# Patient Record
Sex: Male | Born: 1960 | Race: White | Hispanic: No | Marital: Single | Smoking: Current every day smoker
Health system: Southern US, Community
[De-identification: ages and names within clinical notes are randomized; demographics above are authoritative.]

## PROBLEM LIST (undated history)

## (undated) DIAGNOSIS — M5020 Other cervical disc displacement, unspecified cervical region: Secondary | ICD-10-CM

## (undated) DIAGNOSIS — N189 Chronic kidney disease, unspecified: Secondary | ICD-10-CM

## (undated) DIAGNOSIS — R011 Cardiac murmur, unspecified: Secondary | ICD-10-CM

## (undated) DIAGNOSIS — R45851 Suicidal ideations: Secondary | ICD-10-CM

## (undated) DIAGNOSIS — E785 Hyperlipidemia, unspecified: Secondary | ICD-10-CM

## (undated) DIAGNOSIS — F603 Borderline personality disorder: Secondary | ICD-10-CM

## (undated) DIAGNOSIS — F259 Schizoaffective disorder, unspecified: Secondary | ICD-10-CM

## (undated) DIAGNOSIS — F4310 Post-traumatic stress disorder, unspecified: Secondary | ICD-10-CM

## (undated) DIAGNOSIS — F329 Major depressive disorder, single episode, unspecified: Secondary | ICD-10-CM

## (undated) DIAGNOSIS — N529 Male erectile dysfunction, unspecified: Secondary | ICD-10-CM

## (undated) DIAGNOSIS — N400 Enlarged prostate without lower urinary tract symptoms: Secondary | ICD-10-CM

---

## 2000-08-31 ENCOUNTER — Inpatient Hospital Stay (HOSPITAL_COMMUNITY): Admission: EM | Admit: 2000-08-31 | Discharge: 2000-09-07 | Payer: Self-pay | Admitting: Psychiatry

## 2001-01-26 ENCOUNTER — Inpatient Hospital Stay (HOSPITAL_COMMUNITY): Admission: EM | Admit: 2001-01-26 | Discharge: 2001-01-29 | Payer: Self-pay | Admitting: *Deleted

## 2001-01-26 ENCOUNTER — Encounter: Admission: RE | Admit: 2001-01-26 | Discharge: 2001-01-26 | Payer: Self-pay | Admitting: Emergency Medicine

## 2003-01-03 ENCOUNTER — Emergency Department (HOSPITAL_COMMUNITY): Admission: EM | Admit: 2003-01-03 | Discharge: 2003-01-03 | Payer: Self-pay | Admitting: Emergency Medicine

## 2003-01-22 ENCOUNTER — Inpatient Hospital Stay (HOSPITAL_COMMUNITY): Admission: EM | Admit: 2003-01-22 | Discharge: 2003-02-07 | Payer: Self-pay | Admitting: Psychiatry

## 2003-01-25 ENCOUNTER — Encounter (HOSPITAL_COMMUNITY): Payer: Self-pay | Admitting: Psychiatry

## 2003-03-03 ENCOUNTER — Encounter: Payer: Self-pay | Admitting: Emergency Medicine

## 2003-03-03 ENCOUNTER — Emergency Department (HOSPITAL_COMMUNITY): Admission: EM | Admit: 2003-03-03 | Discharge: 2003-03-04 | Payer: Self-pay | Admitting: Emergency Medicine

## 2003-09-02 ENCOUNTER — Emergency Department (HOSPITAL_COMMUNITY): Admission: EM | Admit: 2003-09-02 | Discharge: 2003-09-02 | Payer: Self-pay | Admitting: Emergency Medicine

## 2004-08-16 ENCOUNTER — Inpatient Hospital Stay (HOSPITAL_COMMUNITY): Admission: RE | Admit: 2004-08-16 | Discharge: 2004-08-23 | Payer: Self-pay | Admitting: Psychiatry

## 2005-10-08 ENCOUNTER — Emergency Department (HOSPITAL_COMMUNITY): Admission: EM | Admit: 2005-10-08 | Discharge: 2005-10-08 | Payer: Self-pay | Admitting: Emergency Medicine

## 2013-01-23 ENCOUNTER — Encounter (HOSPITAL_COMMUNITY): Payer: Self-pay | Admitting: Internal Medicine

## 2013-01-23 ENCOUNTER — Other Ambulatory Visit: Payer: Self-pay

## 2013-01-23 ENCOUNTER — Inpatient Hospital Stay (HOSPITAL_COMMUNITY): Payer: Medicare Other

## 2013-01-23 ENCOUNTER — Inpatient Hospital Stay (HOSPITAL_COMMUNITY)
Admit: 2013-01-23 | Discharge: 2013-02-04 | DRG: 917 | Disposition: A | Discharge status: Home / Self Care | Payer: Medicare Other | Attending: Internal Medicine | Admitting: Internal Medicine

## 2013-01-23 DIAGNOSIS — E876 Hypokalemia: Secondary | ICD-10-CM | POA: Diagnosis present

## 2013-01-23 DIAGNOSIS — J9600 Acute respiratory failure, unspecified whether with hypoxia or hypercapnia: Secondary | ICD-10-CM | POA: Diagnosis present

## 2013-01-23 DIAGNOSIS — E872 Acidosis, unspecified: Secondary | ICD-10-CM | POA: Diagnosis present

## 2013-01-23 DIAGNOSIS — F4310 Post-traumatic stress disorder, unspecified: Secondary | ICD-10-CM | POA: Diagnosis present

## 2013-01-23 DIAGNOSIS — R45851 Suicidal ideations: Secondary | ICD-10-CM

## 2013-01-23 DIAGNOSIS — R9431 Abnormal electrocardiogram [ECG] [EKG]: Secondary | ICD-10-CM

## 2013-01-23 DIAGNOSIS — Z7982 Long term (current) use of aspirin: Secondary | ICD-10-CM

## 2013-01-23 DIAGNOSIS — T6592XA Toxic effect of unspecified substance, intentional self-harm, initial encounter: Secondary | ICD-10-CM | POA: Diagnosis present

## 2013-01-23 DIAGNOSIS — N189 Chronic kidney disease, unspecified: Secondary | ICD-10-CM | POA: Diagnosis present

## 2013-01-23 DIAGNOSIS — Z79899 Other long term (current) drug therapy: Secondary | ICD-10-CM

## 2013-01-23 DIAGNOSIS — N179 Acute kidney failure, unspecified: Secondary | ICD-10-CM

## 2013-01-23 DIAGNOSIS — T528X1A Toxic effect of other organic solvents, accidental (unintentional), initial encounter: Secondary | ICD-10-CM

## 2013-01-23 DIAGNOSIS — T65891A Toxic effect of other specified substances, accidental (unintentional), initial encounter: Secondary | ICD-10-CM

## 2013-01-23 DIAGNOSIS — E870 Hyperosmolality and hypernatremia: Secondary | ICD-10-CM | POA: Diagnosis present

## 2013-01-23 DIAGNOSIS — N400 Enlarged prostate without lower urinary tract symptoms: Secondary | ICD-10-CM | POA: Diagnosis present

## 2013-01-23 DIAGNOSIS — T6591XA Toxic effect of unspecified substance, accidental (unintentional), initial encounter: Secondary | ICD-10-CM

## 2013-01-23 DIAGNOSIS — T50902A Poisoning by unspecified drugs, medicaments and biological substances, intentional self-harm, initial encounter: Secondary | ICD-10-CM | POA: Diagnosis present

## 2013-01-23 DIAGNOSIS — K5900 Constipation, unspecified: Secondary | ICD-10-CM | POA: Diagnosis present

## 2013-01-23 DIAGNOSIS — G9340 Encephalopathy, unspecified: Secondary | ICD-10-CM | POA: Diagnosis present

## 2013-01-23 DIAGNOSIS — R319 Hematuria, unspecified: Secondary | ICD-10-CM | POA: Diagnosis present

## 2013-01-23 HISTORY — DX: Chronic kidney disease, unspecified: N18.9

## 2013-01-23 HISTORY — DX: Hyperlipidemia, unspecified: E78.5

## 2013-01-23 HISTORY — DX: Borderline personality disorder: F60.3

## 2013-01-23 HISTORY — DX: Major depressive disorder, single episode, unspecified: F32.9

## 2013-01-23 HISTORY — DX: Suicidal ideations: R45.851

## 2013-01-23 HISTORY — DX: Male erectile dysfunction, unspecified: N52.9

## 2013-01-23 HISTORY — DX: Benign prostatic hyperplasia without lower urinary tract symptoms: N40.0

## 2013-01-23 HISTORY — DX: Other cervical disc displacement, unspecified cervical region: M50.20

## 2013-01-23 HISTORY — DX: Cardiac murmur, unspecified: R01.1

## 2013-01-23 HISTORY — DX: Schizoaffective disorder, unspecified: F25.9

## 2013-01-23 HISTORY — DX: Post-traumatic stress disorder, unspecified: F43.10

## 2013-01-23 LAB — COMPREHENSIVE METABOLIC PANEL
AST: 19 U/L (ref 0–37)
Albumin: 4 g/dL (ref 3.5–5.2)
Alkaline Phosphatase: 108 U/L (ref 39–117)
BUN: 14 mg/dL (ref 6–23)
Potassium: 4.5 mEq/L (ref 3.5–5.1)
Total Protein: 7.3 g/dL (ref 6.0–8.3)

## 2013-01-23 LAB — CBC
HCT: 43.4 % (ref 39.0–52.0)
MCHC: 33.4 g/dL (ref 30.0–36.0)
Platelets: 227 10*3/uL (ref 150–400)
RDW: 13.3 % (ref 11.5–15.5)
WBC: 5.3 10*3/uL (ref 4.0–10.5)

## 2013-01-23 LAB — BLOOD GAS, ARTERIAL
Acid-base deficit: 8 mmol/L — ABNORMAL HIGH (ref 0.0–2.0)
Bicarbonate: 16.1 mEq/L — ABNORMAL LOW (ref 20.0–24.0)
FIO2: 0.4 %
O2 Saturation: 97.8 %
RATE: 28 resp/min
pCO2 arterial: 20 mmHg — ABNORMAL LOW (ref 35.0–45.0)
pH, Arterial: 7.289 — ABNORMAL LOW (ref 7.350–7.450)
pO2, Arterial: 149 mmHg — ABNORMAL HIGH (ref 80.0–100.0)

## 2013-01-23 LAB — BASIC METABOLIC PANEL
CO2: 18 mEq/L — ABNORMAL LOW (ref 19–32)
Calcium: 8.4 mg/dL (ref 8.4–10.5)
GFR calc non Af Amer: 51 mL/min — ABNORMAL LOW (ref >90)
Potassium: 3.8 mEq/L (ref 3.5–5.1)
Sodium: 152 mEq/L — ABNORMAL HIGH (ref 135–145)

## 2013-01-23 LAB — ACETAMINOPHEN LEVEL: Acetaminophen (Tylenol), Serum: <15 ug/mL (ref 10–30)

## 2013-01-23 LAB — RAPID URINE DRUG SCREEN, HOSP PERFORMED
Benzodiazepines: NOT DETECTED
Cocaine: NOT DETECTED
Opiates: NOT DETECTED
Tetrahydrocannabinol: NOT DETECTED

## 2013-01-23 LAB — URINALYSIS, ROUTINE W REFLEX MICROSCOPIC
Glucose, UA: NEGATIVE mg/dL
Glucose, UA: 100 mg/dL — AB
Leukocytes, UA: NEGATIVE
Specific Gravity, Urine: 1.009 (ref 1.005–1.030)
Urobilinogen, UA: 0.2 mg/dL (ref 0.0–1.0)
pH: 5.5 (ref 5.0–8.0)
pH: 6 (ref 5.0–8.0)

## 2013-01-23 LAB — ETHANOL: Alcohol, Ethyl (B): <11 mg/dL (ref 0–11)

## 2013-01-23 LAB — LACTIC ACID, PLASMA: Lactic Acid, Venous: 6 mmol/L — ABNORMAL HIGH (ref 0.5–2.2)

## 2013-01-23 LAB — URINE MICROSCOPIC-ADD ON

## 2013-01-23 LAB — OSMOLALITY: Osmolality: 380 mOsm/kg — ABNORMAL HIGH (ref 275–300)

## 2013-01-23 MED ORDER — SODIUM CHLORIDE 0.9 % IV BOLUS (SEPSIS)
1000.0000 mL | Freq: Once | INTRAVENOUS | Status: AC
  Administered 2013-01-23: 1000 mL via INTRAVENOUS

## 2013-01-23 MED ORDER — PROPOFOL 10 MG/ML IV EMUL
INTRAVENOUS | Status: AC
  Filled 2013-01-23: qty 100

## 2013-01-23 MED ORDER — THIAMINE HCL 100 MG/ML IJ SOLN: 100.0000 mg | Freq: Every day | INTRAMUSCULAR | Status: DC

## 2013-01-23 MED ORDER — PYRIDOXINE HCL 100 MG/ML IJ SOLN: 50.0000 mg | Freq: Every day | INTRAMUSCULAR | Status: DC

## 2013-01-23 MED ORDER — SODIUM CHLORIDE 0.9 % IV SOLN
INTRAVENOUS | Status: DC
  Administered 2013-01-23: 16:00:00 via INTRAVENOUS

## 2013-01-23 MED ORDER — PYRIDOXINE HCL 100 MG/ML IJ SOLN
100.0000 mg | Freq: Four times a day (QID) | INTRAMUSCULAR | Status: DC
  Administered 2013-01-23 – 2013-01-27 (×14): 100 mg via INTRAVENOUS
  Filled 2013-01-23 (×19): qty 1

## 2013-01-23 MED ORDER — SODIUM BICARBONATE 8.4 % IV SOLN
INTRAVENOUS | Status: AC
  Filled 2013-01-23: qty 150

## 2013-01-23 MED ORDER — SODIUM BICARBONATE 8.4 % IV SOLN
INTRAVENOUS | Status: AC
  Filled 2013-01-23: qty 50

## 2013-01-23 MED ORDER — SODIUM CHLORIDE 0.9 % IV SOLN
INTRAVENOUS | Status: DC
  Administered 2013-01-23: 22:00:00 via INTRAVENOUS

## 2013-01-23 MED ORDER — PROPOFOL 10 MG/ML IV EMUL
5.0000 ug/kg/min | INTRAVENOUS | Status: DC
  Administered 2013-01-23: 1000 mg via INTRAVENOUS
  Administered 2013-01-23: 40 ug/kg/min via INTRAVENOUS
  Administered 2013-01-24: 70 ug/kg/min via INTRAVENOUS
  Administered 2013-01-24: 60 ug/kg/min via INTRAVENOUS
  Administered 2013-01-24: 70 ug/kg/min via INTRAVENOUS
  Filled 2013-01-23 (×4): qty 100

## 2013-01-23 MED ORDER — BIOTENE DRY MOUTH MT LIQD
15.0000 mL | Freq: Four times a day (QID) | OROMUCOSAL | Status: DC
  Administered 2013-01-24 (×4): 15 mL via OROMUCOSAL

## 2013-01-23 MED ORDER — SODIUM CHLORIDE 0.45 % IV SOLN
INTRAVENOUS | Status: DC
  Administered 2013-01-23 – 2013-01-24 (×2): via INTRAVENOUS

## 2013-01-23 MED ORDER — SODIUM CHLORIDE 0.9 % IV SOLN
15.0000 mg/kg | Freq: Once | INTRAVENOUS | Status: AC
  Administered 2013-01-23: 1500 mg via INTRAVENOUS
  Filled 2013-01-23: qty 1.5

## 2013-01-23 MED ORDER — SODIUM CHLORIDE 0.9 % IV SOLN: 50.0000 mg | Freq: Four times a day (QID) | INTRAVENOUS | Status: DC

## 2013-01-23 MED ORDER — FOMEPIZOLE 1 GM/ML IV SOLN
10.0000 mg/kg | INTRAVENOUS | Status: AC
  Administered 2013-01-24 (×2): 1000 mg via INTRAVENOUS
  Filled 2013-01-23 (×2): qty 1

## 2013-01-23 MED ORDER — MIDAZOLAM HCL 2 MG/2ML IJ SOLN
INTRAMUSCULAR | Status: AC
  Administered 2013-01-23: 2 mg
  Filled 2013-01-23: qty 2

## 2013-01-23 MED ORDER — PROPOFOL 10 MG/ML IV EMUL
INTRAVENOUS | Status: AC
  Administered 2013-01-23: 1000 mg via INTRAVENOUS
  Filled 2013-01-23: qty 100

## 2013-01-23 MED ORDER — SUCCINYLCHOLINE CHLORIDE 20 MG/ML IJ SOLN
100.0000 mg | Freq: Once | INTRAMUSCULAR | Status: AC
  Administered 2013-01-23: 100 mg via INTRAVENOUS

## 2013-01-23 MED ORDER — SODIUM BICARBONATE 8.4 % IV SOLN
Freq: Once | INTRAVENOUS | Status: AC
  Administered 2013-01-23: 18:00:00 via INTRAVENOUS
  Filled 2013-01-23: qty 150

## 2013-01-23 MED ORDER — FOLIC ACID 5 MG/ML IJ SOLN: 50.0000 mg | Freq: Every day | INTRAMUSCULAR | Status: DC

## 2013-01-23 MED ORDER — MIDAZOLAM HCL 2 MG/2ML IJ SOLN
INTRAMUSCULAR | Status: AC
  Administered 2013-01-23: 2 mg
  Filled 2013-01-23: qty 4

## 2013-01-23 MED ORDER — HEPARIN SODIUM (PORCINE) 1000 UNIT/ML IJ SOLN
INTRAMUSCULAR | Status: AC
  Administered 2013-01-23: 2400 [IU]
  Filled 2013-01-23: qty 4

## 2013-01-23 MED ORDER — CHLORHEXIDINE GLUCONATE 0.12 % MT SOLN
15.0000 mL | Freq: Two times a day (BID) | OROMUCOSAL | Status: DC
Start: 2013-01-23 — End: 2013-01-24
  Administered 2013-01-23 – 2013-01-24 (×2): 15 mL via OROMUCOSAL
  Filled 2013-01-23 (×2): qty 15

## 2013-01-23 MED ORDER — SODIUM CHLORIDE 0.9 % IV SOLN
50.0000 mg | Freq: Four times a day (QID) | INTRAVENOUS | Status: AC
  Administered 2013-01-23 – 2013-01-24 (×4): 50 mg via INTRAVENOUS
  Filled 2013-01-23 (×6): qty 10

## 2013-01-23 MED ORDER — PANTOPRAZOLE SODIUM 40 MG IV SOLR
40.0000 mg | INTRAVENOUS | Status: DC
  Administered 2013-01-23: 40 mg via INTRAVENOUS
  Filled 2013-01-23 (×2): qty 40

## 2013-01-23 MED ORDER — THIAMINE HCL 100 MG/ML IJ SOLN
100.0000 mg | Freq: Four times a day (QID) | INTRAMUSCULAR | Status: DC
  Administered 2013-01-23 – 2013-01-27 (×14): 100 mg via INTRAVENOUS
  Filled 2013-01-23 (×19): qty 1

## 2013-01-23 NOTE — ED Notes (Signed)
Pt appears in a stupor, unable to make comprehensible speech.

## 2013-01-23 NOTE — H&P (Signed)
PULMONARY  / CRITICAL CARE MEDICINE  Name: Todd Fitzgerald MRN: [id number] DOB: June 04, 1961    ADMISSION DATE:  01/23/2013 CONSULTATION DATE:  1/26  REFERRING MD :  Dr. Bernette Mayers  CHIEF COMPLAINT:  Possible toxic ingestion. ? antifreeze ingestion  BRIEF PATIENT DESCRIPTION:  52 y.o with possibly ingestion antifreeze.  He has had toxic ingestion of antifreeze in the past and multiple suicide attempts.   SIGNIFICANT EVENTS / STUDIES:  1/26 Admitted   LINES / TUBES: 1/26-ETT  126-foley 1/26 HD catheter  1/26 NGT  CULTURES: 1/26 MRSA screen  1/26 urine culture   ANTIBIOTICS: none  HISTORY OF PRESENT ILLNESS:   52 y.o male brought to the [facility] ED earlier 1/26.  His sister was called at 18 am by his girlfriend who stated the patient was acting delerious and funny and called 911.   He was at his girlfriends home on a home visit from his group home today and started acting strange with thoughts he possibly ingested antifreeze.  He has had toxic ingestions of antifreeze in the past multiple times. He has been hospitalized at [facility] x 3 years and [facility] hospital in the past. Patient lives in a [facility] group home in [place] since 05/03/2012.  Spoke with Shonta at the home who provided history as well as sister [name].  Records pending to be faxed from [facility].    PAST MEDICAL HISTORY :  Past Medical History  Diagnosis Date  . Suicidal ideation     overdose drugs age 5, last 7 years antifreeze ingestion x 4 times  . CKD (chronic kidney disease)     questionable  . Heart murmur   . MDD (major depressive disorder)   . ED (erectile dysfunction)   . BPH (benign prostatic hyperplasia)   . PTSD (post-traumatic stress disorder)   . HLD (hyperlipidemia)   . Borderline personality disorder   . Schizoaffective disorder   . Ruptured disc, cervical    No past surgical history on file.  Prescriptions prior to admission  Medication Sig Dispense Refill  . aspirin 81 MG  tablet Take 81 mg by mouth daily.      . benztropine (COGENTIN) 1 MG tablet Take 3 mg by mouth 2 (two) times daily.      . cholecalciferol (VITAMIN D) 400 UNITS TABS Take 400 Units by mouth. 2 tablets qhs      . cloZAPine (CLOZARIL) 100 MG tablet Take 200 mg by mouth every morning.      . clozapine (FAZACLO) 100 MG disintegrating tablet Take 300 mg by mouth at bedtime.      . fish oil-omega-3 fatty acids 1000 MG capsule Take 2 g by mouth at bedtime.      Marland Kitchen gemfibrozil (LOPID) 600 MG tablet Take 600 mg by mouth 2 (two) times daily.      . haloperidol (HALDOL) 2 MG tablet Take 2 mg by mouth every morning.      . haloperidol (HALDOL) 5 MG tablet Take 5 mg by mouth daily. 5 PM      . isoniazid (NYDRAZID) 100 MG tablet Take 100 mg by mouth daily. 3 tablets on an empty stomach daily      . lamoTRIgine (LAMICTAL) 100 MG tablet Take 100 mg by mouth daily. 2.5 tablets at bedtime      . LORazepam (ATIVAN) 0.5 MG tablet Take 0.5 mg by mouth 2 (two) times daily as needed.      . polyethylene glycol (MIRALAX /  GLYCOLAX) packet Take 17 g by mouth daily.      Marland Kitchen pyridOXINE (VITAMIN B-6) 50 MG tablet Take 50 mg by mouth daily. 1/2 tablet daily      . ranitidine (ZANTAC) 150 MG tablet Take 150 mg by mouth 2 (two) times daily.      . selegiline (EMSAM) 12 MG/24HR Place 1 patch onto the skin daily.      Marland Kitchen senna (SENOKOT) 8.6 MG tablet Take 1 tablet by mouth 2 (two) times daily.      . simvastatin (ZOCOR) 20 MG tablet Take 20 mg by mouth daily.      . Tamsulosin HCl (FLOMAX) 0.4 MG CAPS Take 0.4 mg by mouth daily after breakfast.      . topiramate (TOPAMAX) 50 MG tablet Take 50 mg by mouth at bedtime.      . traZODone (DESYREL) 100 MG tablet Take 100 mg by mouth at bedtime. 2 tablets at bedtime      . vardenafil (LEVITRA) 10 MG tablet Take 10 mg by mouth as needed.        Allergies no known allergies  FAMILY HISTORY: per sister denies   SOCIAL HISTORY: Lives in a [facility] group home   REVIEW OF SYSTEMS:     Unable to obtain  SUBJECTIVE: Unable to obtain  VITAL SIGNS: Temp:  [97.5 F (36.4 C)-98.1 F (36.7 C)] 97.5 F (36.4 C) (01/26 2011) Pulse Rate:  [77-91] 78  (01/26 2310) Resp:  [11-27] 27  (01/26 2310) BP: (106-131)/(72-100) 122/74 mmHg (01/26 2310) SpO2:  [97 %-100 %] 100 % (01/26 2310) FiO2 (%):  [30 %-40 %] 30 % (01/26 2310) Weight:  [220 lb (99.791 kg)] 220 lb (99.791 kg) (01/26 1313) HEMODYNAMICS:   VENTILATOR SETTINGS: Vent Mode:  [-] PRVC FiO2 (%):  [30 %-40 %] 30 % Set Rate:  [14 bmp-28 bmp] 28 bmp Vt Set:  [500 mL] 500 mL PEEP:  [5 cmH20] 5 cmH20 Plateau Pressure:  [16 cmH20] 16 cmH20 INTAKE / OUTPUT: Intake/Output      01/26 0701 - 01/27 0700   Urine (mL/kg/hr) 3200 (1.9)   Total Output 3200   Net -3200         PHYSICAL EXAMINATION: General:  Lying in bed Neuro:  Sedated, perrl b/l HEENT:  [place]/at Cardiovascular:  RRR no murmurs or gallops  Lungs:  ctab Abdomen:  Soft, distended, nt, hypoactive bs Musculoskeletal:  Warm no cyanosis or edema Skin:  Intact   LABS:  Lab 01/23/13 2206 01/23/13 2145 01/23/13 2125 01/23/13 1725 01/23/13 1646 01/23/13 1450  HGB -- -- -- -- -- 14.5  WBC -- -- -- -- -- 5.3  PLT -- -- -- -- -- 227  NA -- 152* -- -- -- 138  K -- 3.8 -- -- -- 4.5  CL -- 117* -- -- -- 102  CO2 -- 18* -- -- -- 10*  GLUCOSE -- 87 -- -- -- 115*  BUN -- 14 -- -- -- 14  CREATININE -- 1.55* -- -- -- 1.32  CALCIUM -- 8.4 -- -- -- 9.5  MG -- -- -- -- -- --  PHOS -- -- -- -- -- --  AST -- -- -- -- -- 19  ALT -- -- -- -- -- 13  ALKPHOS -- -- -- -- -- 108  BILITOT -- -- -- -- -- 0.2*  PROT -- -- -- -- -- 7.3  ALBUMIN -- -- -- -- -- 4.0  APTT -- -- -- -- -- --  INR -- -- -- -- -- --  LATICACIDVEN -- -- 4.4* -- 6.0* --  TROPONINI -- -- -- -- -- --  PROCALCITON -- -- -- -- -- --  PROBNP -- -- -- -- -- --  O2SATVEN -- -- -- -- -- --  PHART 7.387 -- -- 7.289* -- --  PCO2ART 27.2* -- -- 20.0* -- --  PO2ART 149.0* -- -- 127.0* -- --    Lab  01/23/13 1951  GLUCAP 175*    UDS negative  Pending ethylene glycol and methanol levels  Salicylate level <2.0 Acetaminophen <15.0  Alcohol <11  Serum osmolality 380   CXR:  1/26 CXR Findings: ET tube is in good position with the tip at the level of  the clavicular heads. Tip of right IJ approach dialysis catheter  projects over the upper superior vena cava. Lungs are clear. No  pneumothorax. Heart size normal.  IMPRESSION:  1. ET tube in good position.  2. Tip of right IJ approach dialysis catheter projects over the  upper superior vena cava. No pneumothorax.  3. No acute finding.   ASSESSMENT / PLAN:  PULMONARY  Lab 01/23/13 2206 01/23/13 1725  PHART 7.387 7.289*  PCO2ART 27.2* 20.0*  PO2ART 149.0* 127.0*  HCO3 16.1* 9.3*  TCO2 17.0 8.3  O2SAT 97.8 97.9    A: ? TB status on INH prior to admission - Intubated for airway protection. (deep sedation needed for emergent HD catheter placement due to agitation)  P:   - Full mechanical ventilation pH>7.3, O2>92% - PRVC, Vt: 8 cc/kg, PEEP: 5, RR: 24 - VAP prevention order set  CARDIOVASCULAR Initial EKG with prolonged QT, nonspecific T wave changes  A:  - Prolonged QTc (531), resolved  - Hemodynamically stable  P:  - Serial EKG - ICU monitoring  RENAL A:   History of BPH Hypernatremia (152) AKI Creatinine 1.55  Acidosis ? Etiology Ethylene glycol ingestion AG 26  Lactic acidosis 6.0-->4.4  Serum osmolality 380 (high) Osmolar gap 82 (high) Hematuria   P:   serial ABGs Status post Fomepizole x 1, will continue Fomepizole  q 4 hours post HD per pharmacy   Renal consulted Dr. Arlean Hopping aware for emergent HD; per renal recommendations:  BMET and ABG 3 hours after HD; methanol and ethylene glycol level 2 hours post HD;  BMET ABG, serum os in 6 hours  Pending ethylene glycol, methanol levels Already given sodium bicarb 1L, NS 1L, now with NS 150 cc/hr  BMET q4, Lactic acid q6  Thiamine 100 iv qd, B6 50  mg iv qd, Folic acid 50 iv q6 Poison control aware  Strict intake and output  GASTROINTESTINAL A:   Possible Toxic ingestion antifreeze Nutrition   P:   Given Fomepizole, 2L NS, sodium bicarbonate, now getting NS 150 cc/hr NPO Protonix    HEMATOLOGIC A:  No acute issues  DVT px   P:  Trend cbc  scds   INFECTIOUS A:   No active issues  P:   Follow cultures  ENDOCRINE A:   No active issues  P:   cbg q4   NEUROLOGIC/PSYCHIATRIC A:   History of schizoaffective disorder, MDD, PTSD, borderline personality disorder with multiple suicide attempts since age 35 Agitated  Acute encephalopathy   P:   Propofol for RASS 0 to -1  Pending records to review psychiatric medications   TODAY'S SUMMARY: 52 y.o with now intubated on propofol with possibly ingestion antifreeze with multiple suicide attempts in the past.   Pending toxicology labs, lactic acidosis, respiratory acidosis, +anion gap, +osmolar  gap, resolved QTc.  Continue IVF, B6, thiamine, folic acid.  Pending emergent dialysis.  Trend serial labs.  Awaiting records from [facility] for more information (i.e PMH, medications)  Mother [phone] Daneil Dan (Sister) [phone] contact  I have personally obtained a history, examined the patient, evaluated laboratory and imaging results, formulated the assessment and plan and placed orders.  CRITICAL CARE: The patient is critically ill with multiple organ systems failure and requires high complexity decision making for assessment and support, frequent evaluation and titration of therapies, application of advanced monitoring technologies and extensive interpretation of multiple databases. Critical Care Time devoted to patient care services described in this note is 90 minutes.   Desma Maxim MD PGY 1 [phone]   [name], MD Pulmonary and Critical Care Medicine [facility] Pager: [phone]  01/23/2013, 11:40 PM

## 2013-01-23 NOTE — ED Notes (Signed)
Poison control contacted by this RN. Recommendations are being faxed to ED for labs and blocking agent. Patient's RN aware.

## 2013-01-23 NOTE — ED Provider Notes (Addendum)
History  This chart was scribed for Charles B. Bernette Mayers, MD by Marlin Canary ED Scribe. The patient was seen in room APA16A/APA16A. Patient's care was started at 1316.  CSN: [id number]  Arrival date & time 01/23/13  1316   Chief Complaint  Patient presents with  . V70.1    The history is provided by the EMS personnel. No language interpreter was used.   Level 5 Caveat - Complete history could not be obtained due to patient's AMS.  Todd Fitzgerald is a 52 y.o. male who was brought to the Emergency Department for a possible antifreeze ingestion. Per EMS, patient was visiting his family at home when they noticed abnormal behavior. Family stated that they were concerned he drank antifreeze and that patient has done the same in the past. Patient currently lives in a group home.  No past medical history on file.  No past surgical history on file.  No family history on file.  History  Substance Use Topics  . Smoking status: Not on file  . Smokeless tobacco: Not on file  . Alcohol Use: Not on file     Review of Systems Level 5 Caveat - Full ROS could not be obtained due to patient's AMS.   Allergies  Review of patient's allergies indicates not on file.  Home Medications  No current outpatient prescriptions on file.  Triage Vitals: BP 121/72  Pulse 79  Temp 98.1 F (36.7 C) (Oral)  Resp 18  Ht 5\' 9"  (1.753 m)  Wt 220 lb (99.791 kg)  BMI 32.49 kg/m2  SpO2 97%  Physical Exam  Nursing note and vitals reviewed. Constitutional: He appears well-developed and well-nourished.  HENT:  Head: Normocephalic and atraumatic.       Mouth dry.  Eyes: EOM are normal. Pupils are equal, round, and reactive to light.  Neck: Normal range of motion. Neck supple.  Cardiovascular: Normal rate, normal heart sounds and intact distal pulses.   Pulmonary/Chest: Effort normal and breath sounds normal.  Abdominal: Bowel sounds are normal. He exhibits no distension. There is no tenderness.    Musculoskeletal: Normal range of motion. He exhibits no edema and no tenderness.  Neurological: He is alert. He has normal strength.       Alert, but uncooperative. Moves all 4 extremities. Non focal.  Skin: Skin is warm and dry. No rash noted.  Psychiatric: He has a normal mood and affect.    ED Course  Procedures (including critical care time) DIAGNOSTIC STUDIES: Oxygen Saturation is 97% on room air, Adequate by my interpretation.    COORDINATION OF CARE: 1340- Patient here after possible ingestion. Nursing staff is attempting to contact family to gather additional history. Will do labs and give antizol.   Labs Reviewed  COMPREHENSIVE METABOLIC PANEL - Abnormal; Notable for the following:    CO2 10 (*)     Glucose, Bld 115 (*)     Total Bilirubin 0.2 (*)     GFR calc non Af Amer 62 (*)     GFR calc Af Amer 72 (*)     All other components within normal limits  SALICYLATE LEVEL - Abnormal; Notable for the following:    Salicylate Lvl <2.0 (*)  REPEATED TO VERIFY   All other components within normal limits  URINALYSIS, ROUTINE W REFLEX MICROSCOPIC - Abnormal; Notable for the following:    Color, Urine STRAW (*)     All other components within normal limits  BLOOD GAS, ARTERIAL - Abnormal; Notable for the  following:    pH, Arterial 7.289 (*)     pCO2 arterial 20.0 (*)     pO2, Arterial 127.0 (*)     Bicarbonate 9.3 (*)     Acid-base deficit 16.2 (*)     All other components within normal limits  LACTIC ACID, PLASMA - Abnormal; Notable for the following:    Lactic Acid, Venous 6.0 (*)     All other components within normal limits  CBC  ETHANOL  ACETAMINOPHEN LEVEL  URINE RAPID DRUG SCREEN (HOSP PERFORMED)  ETHYLENE GLYCOL  ALCOHOL, METHYL (METHANOL), BLOOD  OSMOLALITY   No results found.   No diagnosis found.    MDM   Date: 01/23/2013  Rate: 79  Rhythm: normal sinus rhythm  QRS Axis: normal  Intervals: QT prolonged  ST/T Wave abnormalities: nonspecific T  wave changes  Conduction Disutrbances:none  Narrative Interpretation:   Old EKG Reviewed: none available  5:57 PM Marked acidosis, presumably from Ethylene Glycol. Measured osmolality and toxic alcohol levels still pending. Discussed with Dr. Arlean Hopping on call for Nephrology at [facility] who agrees with transfer there for dialysis. Dr. Herma Carson, on call for PCCM, will accept the patient in transfer to ICU. Bicarb drip ordered per Dr. Malena Catholic recommendations. Loading dose of Fomepizole given on initial evaluation.   CRITICAL CARE Performed by: Pollyann Savoy   Total critical care time: 75  Critical care time was exclusive of separately billable procedures and treating other patients.  Critical care was necessary to treat or prevent imminent or life-threatening deterioration.  Critical care was time spent personally by me on the following activities: development of treatment plan with patient and/or surrogate as well as nursing, discussions with consultants, evaluation of patient's response to treatment, examination of patient, obtaining history from patient or surrogate, ordering and performing treatments and interventions, ordering and review of laboratory studies, ordering and review of radiographic studies, pulse oximetry and re-evaluation of patient's condition.     I personally performed the services described in this documentation, which was scribed in my presence. The recorded information has been reviewed and is accurate.     Charles B. Bernette Mayers, MD 01/23/13 1759  Bonnita Levan. Bernette Mayers, MD 01/23/13 2139

## 2013-01-23 NOTE — ED Notes (Signed)
Spoke with sister in family waiting area who states that the patient has attempted suicide multiple times in the past and has drank antifreeze multiple times in the past as well.  Sister states that normally the patient has a normal speech pattern.  MD made aware of these new findings.

## 2013-01-23 NOTE — ED Notes (Signed)
Bilateral soft wrist restraints applied.

## 2013-01-23 NOTE — ED Notes (Signed)
Pt awake, incomprehensible speech.

## 2013-01-23 NOTE — ED Notes (Signed)
Per EMS pt was visiting home (he currently lives in a group home).  The family noticed that the patient started acting strange and they discovered that the had possibly drank antifreeze.

## 2013-01-23 NOTE — Consult Note (Signed)
Todd Fitzgerald 01/23/2013 Todd Fitzgerald Requesting Physician:  Dr. Hans Eden  Reason for Consult:  Need for dialysis in patient with metabolic acidosis and suspected ingestion of toxic alcohol HPI: The patient is a 52 y.o. year-old with hx of schozphrenia and multiple suicide attempts including antifreeze ingestion reportedly 4 times in the past.  Pt was brought to ED for abnormal behavior, arrived around 1:15 this afternoon to outside ED. Family reported they were concerned he drank antifreeze and that he had done the same in the past. Patient living currently in a group home and is on numerous psych and seizure meds.   Lab evaluation showed ABG 7.28 / 20 / 127, serum HCO3 10, chloride 102, Na 138, anion gap= 26.  Past was given IV NaHCO3 3 amps and then IV bicarb gtt and transferred to [facility] for evaluation for possible diaysis. Also got fomepizole first dose around 2 pm today.  He was intubated after arrival here; he was communicative prior to intubation with mumbling speech per resident MD.  UA showed  100 prot, mod LE, large Hgb, 3-6 wbc, tntc rbc's. Creat was 1.3   ROS  not available due to AMS   Past Medical History:  Past Medical History  Diagnosis Date  . Suicidal ideation     overdose drugs age 72, last 7 years antifreeze ingestion x 4 times  . CKD (chronic kidney disease)     questionable  . Heart murmur   . Schizophrenia     Past Surgical History: No past surgical history on file.  Family History: No family history on file. Social History:  does not have a smoking history on file. He does not have any smokeless tobacco history on file. His alcohol and drug histories not on file.  Allergies: Allergies no known allergies  Home medications: Prior to Admission medications   Not on File    Labs: Basic Metabolic Panel:  Lab 01/23/13 1610 01/23/13 1450  NA 152* 138  K 3.8 4.5  CL 117* 102  CO2 18* 10*  GLUCOSE 87 115*  BUN 14 14  CREATININE 1.55* 1.32  ALB --  --  CALCIUM 8.4 9.5  PHOS -- --   Liver Function Tests:  Lab 01/23/13 1450  AST 19  ALT 13  ALKPHOS 108  BILITOT 0.2*  PROT 7.3  ALBUMIN 4.0   No results found for this basename: LIPASE:3,AMYLASE:3 in the last 168 hours No results found for this basename: AMMONIA:3 in the last 168 hours CBC:  Lab 01/23/13 1450  WBC 5.3  NEUTROABS --  HGB 14.5  HCT 43.4  MCV 94.3  PLT 227   PT/INR: @labrcntip (inr:5) Cardiac Enzymes: No results found for this basename: CKTOTAL:5,CKMB:5,CKMBINDEX:5,TROPONINI:5 in the last 168 hours CBG:  Lab 01/23/13 1951  GLUCAP 175*     Physical Exam:  Blood pressure 131/87, pulse 91, temperature 97.5 F (36.4 C), temperature source Oral, resp. rate 24, height 5\' 9"  (1.753 m), weight 99.791 kg (220 lb), SpO2 98.00%.  Gen: on vent, sedated, unresponsive Skin: no rash, cyanosis HEENT:  EOMI, sclera anicteric, throat with ETT in place Neck: no JVD, no LAN Chest: clear bilat, no rales or rhonchi or wheezing CV: regular, no rub or gallop, no carotid or femoral bruits, pedal pulses intact Abdomen: soft, obese, protuberant, no HM or SM, no ascites, dec'Fitzgerald BS Ext: no LE or UE edema, no joint effusion or deformity, no gangrene or ulceration Neuro: unresponsive, not moving extremities   Impression/Plan 1. Suspected toxic alcohol ingestion  with hx of same (antifreeze); high osmolar gap of 82 mosm/kg and +AG metabolic acidosis with initial serum CO2 of 10 and arterial pH of 7.28 w pCO2 of 20.  Pt has long hx of mental illness, suicide attempts and antifreeze ingestion. Degree of acidosis and high suspicion of toxic ingestion warrants dialysis Rx. Plan HD tonight with high clearance filter, 6 hour session.  Discussed with Dr. Loistine Chance and poison control MD on call. Follow serum HCO3, AG and arterial pH for clinical response.  Would get ethylene glycol level again 2 hours after HD (long turnaround time understood, but will be good to have documented if has any  problems later on).  Fomepizole should be continued after HD and should be given every 4 hours while pt is on dialysis.  2. Hypernatremia- this is due to amps of hypertonic Na that were given at outside ED, will change IVF to 1/2 NS for now.  3. VDRF 4. Hx multiple suicide attempts in past 5. Hx schizophrenia   Vinson Moselle  MD [facility] [phone] pgr    [phone] cell 01/23/2013, 10:57 PM

## 2013-01-24 ENCOUNTER — Inpatient Hospital Stay (HOSPITAL_COMMUNITY): Payer: Medicare Other

## 2013-01-24 DIAGNOSIS — T6591XA Toxic effect of unspecified substance, accidental (unintentional), initial encounter: Secondary | ICD-10-CM | POA: Diagnosis present

## 2013-01-24 DIAGNOSIS — R319 Hematuria, unspecified: Secondary | ICD-10-CM | POA: Diagnosis present

## 2013-01-24 LAB — BASIC METABOLIC PANEL
BUN: 6 mg/dL (ref 6–23)
Calcium: 8.3 mg/dL — ABNORMAL LOW (ref 8.4–10.5)
Chloride: 97 mEq/L (ref 96–112)
Chloride: 98 mEq/L (ref 96–112)
Creatinine, Ser: 1.84 mg/dL — ABNORMAL HIGH (ref 0.50–1.35)
Creatinine, Ser: 2.68 mg/dL — ABNORMAL HIGH (ref 0.50–1.35)
GFR calc Af Amer: >90 mL/min (ref >90)
GFR calc Af Amer: 30 mL/min — ABNORMAL LOW (ref >90)
GFR calc non Af Amer: >90 mL/min (ref >90)
GFR calc non Af Amer: 41 mL/min — ABNORMAL LOW (ref >90)
GFR calc non Af Amer: 26 mL/min — ABNORMAL LOW (ref >90)
Glucose, Bld: 93 mg/dL (ref 70–99)
Glucose, Bld: 128 mg/dL — ABNORMAL HIGH (ref 70–99)
Potassium: 3.2 mEq/L — ABNORMAL LOW (ref 3.5–5.1)
Sodium: 147 mEq/L — ABNORMAL HIGH (ref 135–145)

## 2013-01-24 LAB — CBC
HCT: 39.7 % (ref 39.0–52.0)
HCT: 40.6 % (ref 39.0–52.0)
Hemoglobin: 13.7 g/dL (ref 13.0–17.0)
Hemoglobin: 12.8 g/dL — ABNORMAL LOW (ref 13.0–17.0)
MCH: 31.7 pg (ref 26.0–34.0)
MCH: 30.9 pg (ref 26.0–34.0)
MCH: 31.2 pg (ref 26.0–34.0)
MCHC: 34.8 g/dL (ref 30.0–36.0)
MCHC: 33.7 g/dL (ref 30.0–36.0)
MCV: 91.3 fL (ref 78.0–100.0)
MCV: 91.4 fL (ref 78.0–100.0)
Platelets: 198 K/uL (ref 150–400)
RBC: 4.44 MIL/uL (ref 4.22–5.81)
RBC: 4.1 MIL/uL — ABNORMAL LOW (ref 4.22–5.81)
RDW: 13.2 % (ref 11.5–15.5)
RDW: 13.2 % (ref 11.5–15.5)
RDW: 13.3 % (ref 11.5–15.5)
WBC: 12.7 K/uL — ABNORMAL HIGH (ref 4.0–10.5)
WBC: 11.8 10*3/uL — ABNORMAL HIGH (ref 4.0–10.5)

## 2013-01-24 LAB — POCT I-STAT 3, ART BLOOD GAS (G3+)
Acid-Base Excess: 9 mmol/L — ABNORMAL HIGH (ref 0.0–2.0)
Bicarbonate: 33.6 meq/L — ABNORMAL HIGH (ref 20.0–24.0)
O2 Saturation: 97 %
Patient temperature: 98.6
TCO2: 35 mmol/L (ref 0–100)
pCO2 arterial: 43.8 mmHg (ref 35.0–45.0)
pH, Arterial: 7.492 — ABNORMAL HIGH (ref 7.350–7.450)
pO2, Arterial: 88 mmHg (ref 80.0–100.0)

## 2013-01-24 LAB — LACTIC ACID, PLASMA: Lactic Acid, Venous: 1.9 mmol/L (ref 0.5–2.2)

## 2013-01-24 LAB — BLOOD GAS, ARTERIAL
Acid-Base Excess: 4.8 mmol/L — ABNORMAL HIGH (ref 0.0–2.0)
Bicarbonate: 26.4 mEq/L — ABNORMAL HIGH (ref 20.0–24.0)
Mode: POSITIVE
O2 Saturation: 98.5 %
PEEP: 5 cmH2O
Patient temperature: 98
TCO2: 27.3 mmol/L (ref 0–100)
TCO2: 29.2 mmol/L (ref 0–100)
pH, Arterial: 7.557 — ABNORMAL HIGH (ref 7.350–7.450)
pO2, Arterial: 115 mmHg — ABNORMAL HIGH (ref 80.0–100.0)

## 2013-01-24 LAB — HEPATITIS B SURFACE ANTIGEN: Hepatitis B Surface Ag: NEGATIVE

## 2013-01-24 LAB — OCCULT BLOOD GASTRIC / DUODENUM (SPECIMEN CUP): Occult Blood, Gastric: POSITIVE — AB

## 2013-01-24 LAB — PROTIME-INR
INR: 1.04 (ref 0.00–1.49)
Prothrombin Time: 13.5 s (ref 11.6–15.2)

## 2013-01-24 LAB — PHOSPHORUS: Phosphorus: 3.7 mg/dL (ref 2.3–4.6)

## 2013-01-24 LAB — GLUCOSE, CAPILLARY
Glucose-Capillary: 107 mg/dL — ABNORMAL HIGH (ref 70–99)
Glucose-Capillary: 134 mg/dL — ABNORMAL HIGH (ref 70–99)
Glucose-Capillary: 70 mg/dL (ref 70–99)

## 2013-01-24 LAB — HEPATITIS B CORE ANTIBODY, TOTAL: Hep B Core Total Ab: NEGATIVE

## 2013-01-24 LAB — HEPATIC FUNCTION PANEL
Albumin: 3.9 g/dL (ref 3.5–5.2)
Alkaline Phosphatase: 108 U/L (ref 39–117)
Indirect Bilirubin: 0.3 mg/dL (ref 0.3–0.9)
Total Bilirubin: 0.4 mg/dL (ref 0.3–1.2)
Total Protein: 6.9 g/dL (ref 6.0–8.3)

## 2013-01-24 LAB — CALCIUM, IONIZED: Calcium, Ion: 1.07 mmol/L — ABNORMAL LOW (ref 1.12–1.32)

## 2013-01-24 MED ORDER — POTASSIUM PHOSPHATE DIBASIC 3 MMOLE/ML IV SOLN
30.0000 mmol | Freq: Once | INTRAVENOUS | Status: AC
  Administered 2013-01-24: 30 mmol via INTRAVENOUS
  Filled 2013-01-24: qty 10

## 2013-01-24 MED ORDER — BIOTENE DRY MOUTH MT LIQD
15.0000 mL | Freq: Two times a day (BID) | OROMUCOSAL | Status: DC
  Administered 2013-01-24 – 2013-01-26 (×4): 15 mL via OROMUCOSAL

## 2013-01-24 MED ORDER — LIDOCAINE HCL (PF) 1 % IJ SOLN: 5.0000 mL | INTRAMUSCULAR | Status: DC | PRN

## 2013-01-24 MED ORDER — HEPARIN SODIUM (PORCINE) 1000 UNIT/ML DIALYSIS: 1000.0000 [IU] | INTRAMUSCULAR | Status: DC | PRN

## 2013-01-24 MED ORDER — SODIUM CHLORIDE 0.9 % IV SOLN
8.0000 mg/h | INTRAVENOUS | Status: DC
  Administered 2013-01-24 – 2013-01-25 (×2): 8 mg/h via INTRAVENOUS
  Filled 2013-01-24 (×5): qty 80

## 2013-01-24 MED ORDER — ONDANSETRON HCL 4 MG/2ML IJ SOLN
4.0000 mg | Freq: Four times a day (QID) | INTRAMUSCULAR | Status: DC | PRN
  Administered 2013-01-24 – 2013-01-30 (×6): 4 mg via INTRAVENOUS
  Filled 2013-01-24 (×6): qty 2

## 2013-01-24 MED ORDER — POTASSIUM CHLORIDE 20 MEQ/15ML (10%) PO LIQD
40.0000 meq | Freq: Once | ORAL | Status: DC
  Filled 2013-01-24: qty 30

## 2013-01-24 MED ORDER — POTASSIUM CHLORIDE 10 MEQ/50ML IV SOLN
10.0000 meq | INTRAVENOUS | Status: AC
  Administered 2013-01-24 (×2): 10 meq via INTRAVENOUS
  Filled 2013-01-24: qty 100

## 2013-01-24 MED ORDER — ALTEPLASE 2 MG IJ SOLR
2.0000 mg | Freq: Once | INTRAMUSCULAR | Status: AC | PRN
  Filled 2013-01-24: qty 2

## 2013-01-24 MED ORDER — ACETAMINOPHEN 160 MG/5ML PO SOLN
500.0000 mg | ORAL | Status: DC | PRN
  Administered 2013-01-24: 500 mg
  Filled 2013-01-24: qty 15.6
  Filled 2013-01-24: qty 20.3
  Filled 2013-01-24: qty 15.6

## 2013-01-24 MED ORDER — SODIUM CHLORIDE 0.9 % IV SOLN
INTRAVENOUS | Status: DC
  Administered 2013-01-24 – 2013-01-30 (×10): via INTRAVENOUS

## 2013-01-24 MED ORDER — PENTAFLUOROPROP-TETRAFLUOROETH EX AERO: 1.0000 "application " | INHALATION_SPRAY | CUTANEOUS | Status: DC | PRN

## 2013-01-24 MED ORDER — MAGNESIUM SULFATE 40 MG/ML IJ SOLN
2.0000 g | Freq: Once | INTRAMUSCULAR | Status: AC
  Administered 2013-01-24: 2 g via INTRAVENOUS
  Filled 2013-01-24: qty 50

## 2013-01-24 MED ORDER — SODIUM CHLORIDE 0.9 % IV SOLN
500.0000 mg | Freq: Once | INTRAVENOUS | Status: AC
  Administered 2013-01-24: 500 mg via INTRAVENOUS
  Filled 2013-01-24: qty 0.5

## 2013-01-24 MED ORDER — POTASSIUM CHLORIDE 10 MEQ/50ML IV SOLN
10.0000 meq | INTRAVENOUS | Status: AC
  Administered 2013-01-24 (×4): 10 meq via INTRAVENOUS
  Filled 2013-01-24 (×3): qty 50

## 2013-01-24 MED ORDER — SODIUM CHLORIDE 0.9 % IV SOLN
15.0000 mg/kg | Freq: Two times a day (BID) | INTRAVENOUS | Status: DC
  Filled 2013-01-24: qty 1.4

## 2013-01-24 MED ORDER — HEPARIN SODIUM (PORCINE) 5000 UNIT/ML IJ SOLN
5000.0000 [IU] | Freq: Three times a day (TID) | INTRAMUSCULAR | Status: DC
  Administered 2013-01-24: 5000 [IU] via SUBCUTANEOUS
  Filled 2013-01-24 (×3): qty 1

## 2013-01-24 MED ORDER — SODIUM CHLORIDE 0.9 % IV SOLN: 10.0000 mg/kg | Freq: Two times a day (BID) | INTRAVENOUS | Status: DC

## 2013-01-24 MED ORDER — SODIUM CHLORIDE 0.9 % IV SOLN: 100.0000 mL | INTRAVENOUS | Status: DC | PRN

## 2013-01-24 MED ORDER — SODIUM CHLORIDE 0.9 % IV SOLN
10.0000 mg/kg | Freq: Two times a day (BID) | INTRAVENOUS | Status: AC
  Administered 2013-01-24 – 2013-01-25 (×2): 1000 mg via INTRAVENOUS
  Filled 2013-01-24 (×3): qty 1

## 2013-01-24 MED ORDER — LIDOCAINE-PRILOCAINE 2.5-2.5 % EX CREA: 1.0000 "application " | TOPICAL_CREAM | CUTANEOUS | Status: DC | PRN

## 2013-01-24 MED ORDER — HEPARIN SODIUM (PORCINE) 1000 UNIT/ML DIALYSIS
20.0000 [IU]/kg | INTRAMUSCULAR | Status: DC | PRN
  Administered 2013-01-24: 2000 [IU] via INTRAVENOUS_CENTRAL
  Filled 2013-01-24: qty 2

## 2013-01-24 MED ORDER — NEPRO/CARBSTEADY PO LIQD: 237.0000 mL | ORAL | Status: DC | PRN

## 2013-01-24 NOTE — Procedures (Signed)
Extubation Procedure Note  Patient Details:   Name: Todd Fitzgerald DOB: [birth year] MRN: [id number]   Airway Documentation:  Airway 7.5 mm (Active)  Secured at (cm) 23 cm 01/24/2013  7:48 AM  Measured From Lips 01/24/2013  7:48 AM  Secured Location Center 01/24/2013  7:48 AM  Secured By [employer] 01/24/2013  7:48 AM  Tube Holder Repositioned Yes 01/24/2013  7:48 AM  Cuff Pressure (cm H2O) 24 cm H2O 01/24/2013  7:48 AM    Evaluation  O2 sats: stable throughout Complications: No apparent complications Patient did tolerate procedure well. Bilateral Breath Sounds: Rhonchi Suctioning: Oral;Airway Yes  Ok Anis, MA 01/24/2013, 11:06 AM

## 2013-01-24 NOTE — Procedures (Signed)
Intubation Procedure Note THERESA DOHRMAN [id number] [birth year]  Procedure: Intubation Indications: Sedation needed for emergent dialysis catheter placement  Procedure Details Consent: Unable to obtain consent because of altered level of consciousness. Time Out: Verified patient identification, verified procedure, site/side was marked, verified correct patient position, special equipment/implants available, medications/allergies/relevent history reviewed, required imaging and test results available.  Performed  Medications used: Propofol and succinylcholine Equipment used: Glidescope Grade I View Correct placement confirmed by by auscultation, by CXR and ETCO2 monitor Tube secured at 23 cm at the lip  Evaluation Hemodynamic Status: BP stable throughout; O2 sats: transiently fell during during procedure Patient's Current Condition: stable Complications: No apparent complications Patient did tolerate procedure well. Chest X-ray ordered to verify placement.  CXR: tube position acceptable.   [name], M.D. Pulmonary and Critical Care Medicine Call E-link with questions [phone] 01/24/2013

## 2013-01-24 NOTE — Progress Notes (Signed)
CRITICAL VALUE ALERT  Critical value received:  Phos 1.0  Date of notification:  01-25-12  Time of notification:  0635  Critical value read back:yes  Nurse who received alert:  Jamesetta So  MD notified (1st page): Desma Maxim  Time of first page:  [phone]  Responding MD:  Desma Maxim  Time MD responded:  [phone]

## 2013-01-24 NOTE — Procedures (Signed)
Central Venous Catheter Insertion Procedure Note Todd Fitzgerald [id number] 12/11/1961  Procedure: Insertion of dialysis catheter Indications: Hemodialysis  Procedure Details Consent: Unable to obtain consent because of altered level of consciousness. Time Out: Verified patient identification, verified procedure, site/side was marked, verified correct patient position, special equipment/implants available, medications/allergies/relevent history reviewed, required imaging and test results available.  Performed  Maximum sterile technique was used including antiseptics, cap, gloves, gown, hand hygiene, mask and sheet. Skin prep: Chlorhexidine; local anesthetic administered A dialysis catheter was placed in the right internal jugular vein under direct visualization with ultrasound using the Seldinger technique.  Evaluation Blood flow good Complications: No apparent complications Patient did tolerate procedure well. Chest X-ray ordered to verify placement.  CXR: normal.  [name], M.D. Pulmonary and Critical Care Medicine Call E-link with questions [phone] 01/24/2013, 3:07 AM

## 2013-01-24 NOTE — Progress Notes (Addendum)
PULMONARY  / CRITICAL CARE MEDICINE  Name: Todd Fitzgerald MRN: [id number] DOB: [birth year]    ADMISSION DATE:  01/23/2013 CONSULTATION DATE:  1/26  REFERRING MD :  Dr. Bernette Mayers  CHIEF COMPLAINT:  Possible toxic ingestion. ? antifreeze ingestion  BRIEF PATIENT DESCRIPTION:  52 y.o with possibly ingestion antifreeze.  He has had toxic ingestion of antifreeze in the past and multiple suicide attempts.   SIGNIFICANT EVENTS / STUDIES:  1/26 Admitted to ICU 1/27 Emergent HD  LINES / TUBES: 1/26-ETT  126-foley 1/26 HD catheter  1/26 NGT  CULTURES: 1/26 MRSA screen  1/26 urine culture   ANTIBIOTICS: none  SUBJECTIVE: Receiving bedside HD, arousable  VITAL SIGNS: Temp:  [97.5 F (36.4 C)-98.9 F (37.2 C)] 98.9 F (37.2 C) (01/27 0419) Pulse Rate:  [73-109] 99  (01/27 0723) Resp:  [11-29] 28  (01/27 0723) BP: (70-154)/(43-117) 118/62 mmHg (01/27 0723) SpO2:  [48 %-100 %] 98 % (01/27 0723) FiO2 (%):  [30 %-40 %] 30 % (01/27 0305) Weight:  [205 lb 0.4 oz (93 kg)-220 lb (99.791 kg)] 205 lb 0.4 oz (93 kg) (01/26 1945) HEMODYNAMICS:   VENTILATOR SETTINGS: Vent Mode:  [-] PRVC FiO2 (%):  [30 %-40 %] 30 % Set Rate:  [14 bmp-28 bmp] 28 bmp Vt Set:  [500 mL] 500 mL PEEP:  [5 cmH20] 5 cmH20 Plateau Pressure:  [16 cmH20-19 cmH20] 19 cmH20 INTAKE / OUTPUT: Intake/Output      01/26 0701 - 01/27 0700 01/27 0701 - 01/28 0700   I.V. (mL/kg) 3781.9 (40.7)    IV Piggyback 271    Total Intake(mL/kg) 4052.9 (43.6)    Urine (mL/kg/hr) 4220 (1.9)    Emesis/NG output 700    Total Output 4920    Net -867.1           PHYSICAL EXAMINATION: General:  Lying in bed Neuro:  Sedated, arousable, perrla b/l HEENT:  [place]/at, ETT in place Cardiovascular:  RRR no murmurs or gallops  Lungs:  Coarse b/l breath sounds, on mechanical ventilation Abdomen:  Soft, distended, +bs Musculoskeletal:  Warm no cyanosis or edema Skin:  Intact   LABS:  Lab 01/24/13 0407 01/24/13 0400 01/24/13 0325 01/23/13  2206 01/23/13 2145 01/23/13 2125 01/23/13 1725 01/23/13 1646 01/23/13 1450  HGB -- 13.8 -- -- -- -- -- -- 14.5  WBC -- 9.8 -- -- -- -- -- -- 5.3  PLT -- 208 -- -- -- -- -- -- 227  NA -- 147* -- -- 152* -- -- -- 138  K -- 3.3* -- -- 3.8 -- -- -- --  CL -- 106 -- -- 117* -- -- -- 102  CO2 -- 28 -- -- 18* -- -- -- 10*  GLUCOSE -- 93 -- -- 87 -- -- -- 115*  BUN -- 5* -- -- 14 -- -- -- 14  CREATININE -- 0.84 -- -- 1.55* -- -- -- 1.32  CALCIUM -- 8.3* -- -- 8.4 -- -- -- 9.5  MG -- 1.7 -- -- -- -- -- -- --  PHOS -- 1.0* -- -- -- -- -- -- --  AST -- -- -- -- -- -- -- -- 19  ALT -- -- -- -- -- -- -- -- 13  ALKPHOS -- -- -- -- -- -- -- -- 108  BILITOT -- -- -- -- -- -- -- -- 0.2*  PROT -- -- -- -- -- -- -- -- 7.3  ALBUMIN -- -- -- -- -- -- -- -- 4.0  APTT -- -- -- -- -- -- -- -- --  INR -- -- -- -- -- -- -- -- --  LATICACIDVEN -- -- 1.5 -- -- 4.4* -- 6.0* --  TROPONINI -- -- -- -- -- -- -- -- --  PROCALCITON -- -- -- -- -- -- -- -- --  PROBNP -- -- -- -- -- -- -- -- --  O2SATVEN -- -- -- -- -- -- -- -- --  PHART 7.557* -- -- 7.387 -- -- 7.289* -- --  PCO2ART 29.7* -- -- 27.2* -- -- 20.0* -- --  PO2ART 115.0* -- -- 149.0* -- -- 127.0* -- --    Lab 01/24/13 0337 01/23/13 2356 01/23/13 1951  GLUCAP 91 70 175*   UDS negative  Pending ethylene glycol and methanol levels  Salicylate level <2.0 Acetaminophen <15.0  Alcohol <11  Serum osmolality 380   CXR:  1/27 CXR - no infiltrates, ett wnl , rotated   ASSESSMENT / PLAN:  PULMONARY  Lab 01/24/13 0407 01/23/13 2206 01/23/13 1725  PHART 7.557* 7.387 7.289*  PCO2ART 29.7* 27.2* 20.0*  PO2ART 115.0* 149.0* 127.0*  HCO3 26.4* 16.1* 9.3*  TCO2 27.3 17.0 8.3  O2SAT 98.5 97.8 97.9   A: 1) ETT for airway protection 2) ? TB status on INH prior to admission - Intubated for airway protection. (deep sedation needed for emergent HD catheter placement due to agitation)  P:   - ABg reviewed, reduce rate -weaning this am  cpap 5 ps 5,  goal 2 hrs, abg reviewed -pcxr no infiltrates  CARDIOVASCULAR Initial EKG with prolonged QT, nonspecific T wave changes  A: 1) Prolonged QTc (531)--resolved   P:  - Serial EKG -no role bicarb -cvp assessment  RENAL A:   History of BPH Hypernatremia (152) AKI Creatinine 1.55--improving Acidosis ? Etiology likely 2/2 Ethylene glycol ingestion AG 26-->13 Lactic acidosis 6.0-->4.4  Serum osmolality 380 (high) Osmolar gap 82 (high) Hematuria   P:   serial ABGs reviewed Fomepizole x 1--will continue Fomepizole  q 4 hours post HD per pharmacy  Renal following Dr. Grayland Jack HD 1/27   BMET and ABG 3 hours after HD; methanol and ethylene glycol level 2 hours post HD;  BMET ABG, serum os in 6 hours Pending ethylene glycol, methanol levels Unclear to me if HD needed further Dc all bicarb, no role BMET q4, Lactic acid q6  Thiamine 100 iv qd, B6 50 mg iv qd, Folic acid 50 iv q6 Poison control aware  Strict intake and output Ion calcium STAT Re calculate osm gap  GASTROINTESTINAL A:  1) Possible Toxic ingestion antifreeze  P:   Given Fomepizole, 2L NS, sodium bicarbonate- dc IVF NPO Protonix   HEMATOLOGIC A:  Dvt prevention  P:  Trend cbc  scds Add sub q hep  INFECTIOUS A:   No active issues  P:   Follow urine cx  ENDOCRINE A:   No active issues  P:   cbg q4   NEUROLOGIC/PSYCHIATRIC A:   1) Extensive psychiatric hx: schizoaffective disorder, MDD, PTSD, borderline personality disorder with multiple suicide attempts since age 52 2) Agitation 3) Acute encephalopathy  4) high risk seizures with associated hypocalcemia related to EG  P:   Propofol for RASS 0 to -1  Pending records to review psychiatric medications  Will need psych consult Calcium now Suicide precautrions  Global: Mother [phone] Daneil Dan (Sister) [phone] contact  Clinical SUMMARY: 52 y.o with now intubated on propofol with possibly ingestion antifreeze with  multiple suicide attempts in the past. Unclear to me if needs HD any more.  Weaning vent. Continue fomepizal after HD. Chem frequent, cvp needed  Ccm time 45 min   ,Mcarthur Rossetti. Tyson Alias, MD, FACP Pgr: [phone] [place] [facility]  I have personally obtained a history, examined the patient, evaluated laboratory and imaging results, formulated the assessment and plan and placed orders.  CRITICAL CARE: The patient is critically ill with multiple organ systems failure and requires high complexity decision making for assessment and support, frequent evaluation and titration of therapies, application of advanced monitoring technologies and extensive interpretation of multiple databases. Critical Care Time devoted to patient care services described in this note is 45 minutes.   Mcarthur Rossetti. Tyson Alias, MD, FACP Pgr: [phone]  Pulmonary & Critical Care

## 2013-01-24 NOTE — Progress Notes (Signed)
Subjective:  Patient alert on vent.  Having good UOP.  HD session finished out early this AM Objective Vital signs in last 24 hours: Filed Vitals:   01/24/13 0715 01/24/13 0723 01/24/13 0726 01/24/13 0748  BP: 125/69 118/62 123/68 107/74  Pulse: 109 99  102  Temp:    98.2 F (36.8 C)  TempSrc:    Oral  Resp: 25 28  29   Height:      Weight:    93.4 kg (205 lb 14.6 oz)  SpO2: 96% 98%  95%   Weight change:   Intake/Output Summary (Last 24 hours) at 01/24/13 0828 Last data filed at 01/24/13 0726  Gross per 24 hour  Intake 4052.91 ml  Output   5170 ml  Net -1117.09 ml   Labs: Basic Metabolic Panel:  Lab 01/24/13 1610 01/23/13 2145 01/23/13 1450  NA 147* 152* 138  K 3.3* 3.8 4.5  CL 106 117* 102  CO2 28 18* 10*  GLUCOSE 93 87 115*  BUN 5* 14 14  CREATININE 0.84 1.55* 1.32  CALCIUM 8.3* 8.4 9.5  ALB -- -- --  PHOS 1.0* -- --   Liver Function Tests:  Lab 01/23/13 1450  AST 19  ALT 13  ALKPHOS 108  BILITOT 0.2*  PROT 7.3  ALBUMIN 4.0   No results found for this basename: LIPASE:3,AMYLASE:3 in the last 168 hours No results found for this basename: AMMONIA:3 in the last 168 hours CBC:  Lab 01/24/13 0400 01/23/13 1450  WBC 9.8 5.3  NEUTROABS -- --  HGB 13.8 14.5  HCT 39.7 43.4  MCV 91.3 94.3  PLT 208 227   Cardiac Enzymes: No results found for this basename: CKTOTAL:5,CKMB:5,CKMBINDEX:5,TROPONINI:5 in the last 168 hours CBG:  Lab 01/24/13 0337 01/23/13 2356 01/23/13 1951  GLUCAP 91 70 175*    Iron Studies: No results found for this basename: IRON,TIBC,TRANSFERRIN,FERRITIN in the last 72 hours Studies/Results: Dg Chest Port 1 View  01/23/2013  *RADIOLOGY REPORT*  Clinical Data: Status post intubation and dialysis catheter placement.  PORTABLE CHEST - 1 VIEW  Comparison: None.  Findings: ET tube is in good position with the tip at the level of the clavicular heads.  Tip of right IJ approach dialysis catheter projects over the upper superior vena cava.  Lungs  are clear.  No pneumothorax.  Heart size normal.  IMPRESSION:  1.  ET tube in good position. 2.  Tip of right IJ approach dialysis catheter projects over the upper superior vena cava.  No pneumothorax. 3.  No acute finding.   Original Report Authenticated By: Holley Dexter, M.D.    Medications: Infusions:    . sodium chloride 150 mL/hr at 01/23/13 2337  . propofol 70 mcg/kg/min (01/24/13 0734)    Scheduled Medications:    . antiseptic oral rinse  15 mL Mouth Rinse QID  . chlorhexidine  15 mL Mouth Rinse BID  . folic acid (FOLVITE) IVPB  50 mg Intravenous Q6H  . fomepizole (ANTIZOL) IV  10 mg/kg Intravenous Q12H  . fomepizole (ANTIZOL) IV  500 mg Intravenous Once  . magnesium sulfate 1 - 4 g bolus IVPB  2 g Intravenous Once  . pantoprazole (PROTONIX) IV  40 mg Intravenous Q24H  . potassium phosphate IVPB (mmol)  30 mmol Intravenous Once  . pyridOXINE  100 mg Intravenous Q6H  . thiamine  100 mg Intravenous Q6H    have reviewed scheduled and prn medications.  Physical Exam: General: alert on vent, nods to questions Heart: RRR Lungs:  mostly clear Abdomen: soft, non tender Extremities: minimal edema Dialysis Access: r sided HD cath, placed 1/26  I Assessment/ Plan: Pt is a 52 y.o. yo male who was admitted on 01/23/2013 with confusion and metabolic acidosis/osmolar gap with history of previous episodes of Ethylene glycol ingestion  Assessment/Plan: 1. 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  Patient received a 6 hour dialysis session this AM and remains on fomepizole.  His acidosis has corrected with current management.  Will re-eval with chemistries and blood gas later today to determine if further HD will be needed.  Fortunately good UOP so does not appear to have caused any nephrotoxicity with his ingestion.   2. Hypophosphatemia- being repleted per CCM, will check that level later as well.       Jagjit Riner  A   01/24/2013,8:28 AM  LOS: 1 day

## 2013-01-24 NOTE — Progress Notes (Signed)
CRITICAL CARE RESIDENT NOTE Interim Progress Note    RESULT INTERVENTION TAKEN  1. Mag 1.7 Mag 2 g  2. Phos 1.0 30 mmol phos replaced   3.      Repeat Phos at 2 PM  Signed: Annett Gula, MD  PGY-1, Internal Medicine Resident Pager: [phone]) 01/24/2013, 6:42 AM

## 2013-01-24 NOTE — Progress Notes (Signed)
Utilization Review Completed.Todd Fitzgerald T12/06/2013  

## 2013-01-24 NOTE — Progress Notes (Signed)
[facility] Nursing ICU Electrolyte Replacement Protocol  Patient Name: Todd Fitzgerald DOB: [birth year] MRN: [id number]  Date of Service  01/24/2013   HPI/Events of Note    Lab 01/24/13 0400 01/23/13 2145 01/23/13 1450  NA 147* 152* 138  K 3.3* 3.8 --  CL 106 117* 102  CO2 28 18* 10*  GLUCOSE 93 87 115*  BUN 5* 14 14  CREATININE 0.84 1.55* 1.32  CALCIUM 8.3* 8.4 9.5  MG -- -- --  PHOS -- -- --    Estimated Creatinine Clearance: 117.7 ml/min (by C-G formula based on Cr of 0.84).  Intake/Output      01/26 0701 - 01/27 0700   I.V. (mL/kg) 2943.1 (31.6)   IV Piggyback 161   Total Intake(mL/kg) 3104.1 (33.4)   Urine (mL/kg/hr) 4065 (1.8)   Emesis/NG output 300   Total Output 4365   Net -1260.9        - I/O DETAILED x 24h    Total I/O In: 2104.1 [I.V.:1943.1; IV Piggyback:161] Out: 1165 [Urine:865; Emesis/NG output:300] - I/O THIS SHIFT  GFR >90  ASSESSMENT   eICURN Interventions  Potassium replaced per protocol   ASSESSMENT: MAJOR ELECTROLYTE    Ardelle Park 01/24/2013, 4:57 AM

## 2013-01-25 ENCOUNTER — Inpatient Hospital Stay (HOSPITAL_COMMUNITY): Payer: Medicare Other

## 2013-01-25 ENCOUNTER — Encounter (HOSPITAL_COMMUNITY): Payer: Self-pay | Admitting: *Deleted

## 2013-01-25 DIAGNOSIS — R319 Hematuria, unspecified: Secondary | ICD-10-CM

## 2013-01-25 DIAGNOSIS — T528X1A Toxic effect of other organic solvents, accidental (unintentional), initial encounter: Secondary | ICD-10-CM

## 2013-01-25 DIAGNOSIS — T65891A Toxic effect of other specified substances, accidental (unintentional), initial encounter: Principal | ICD-10-CM

## 2013-01-25 DIAGNOSIS — J9600 Acute respiratory failure, unspecified whether with hypoxia or hypercapnia: Secondary | ICD-10-CM

## 2013-01-25 DIAGNOSIS — N179 Acute kidney failure, unspecified: Secondary | ICD-10-CM

## 2013-01-25 LAB — COMPREHENSIVE METABOLIC PANEL
ALT: 9 U/L (ref 0–53)
CO2: 29 mEq/L (ref 19–32)
Calcium: 8.4 mg/dL (ref 8.4–10.5)
Creatinine, Ser: 3.23 mg/dL — ABNORMAL HIGH (ref 0.50–1.35)
GFR calc Af Amer: 24 mL/min — ABNORMAL LOW (ref >90)
GFR calc non Af Amer: 21 mL/min — ABNORMAL LOW (ref >90)
Glucose, Bld: 100 mg/dL — ABNORMAL HIGH (ref 70–99)

## 2013-01-25 LAB — PHOSPHORUS: Phosphorus: 4.8 mg/dL — ABNORMAL HIGH (ref 2.3–4.6)

## 2013-01-25 LAB — CBC WITH DIFFERENTIAL/PLATELET
Basophils Relative: 0 % (ref 0–1)
Hemoglobin: 11.8 g/dL — ABNORMAL LOW (ref 13.0–17.0)
MCHC: 33.1 g/dL (ref 30.0–36.0)
Monocytes Relative: 8 % (ref 3–12)
Neutro Abs: 7.5 10*3/uL (ref 1.7–7.7)
Neutrophils Relative %: 77 % (ref 43–77)
Platelets: 178 10*3/uL (ref 150–400)
RBC: 3.87 MIL/uL — ABNORMAL LOW (ref 4.22–5.81)

## 2013-01-25 LAB — POCT I-STAT 3, ART BLOOD GAS (G3+)
O2 Saturation: 95 %
pH, Arterial: 7.476 — ABNORMAL HIGH (ref 7.350–7.450)

## 2013-01-25 LAB — GLUCOSE, CAPILLARY
Glucose-Capillary: 91 mg/dL (ref 70–99)
Glucose-Capillary: 88 mg/dL (ref 70–99)

## 2013-01-25 LAB — CBC
MCH: 31.4 pg (ref 26.0–34.0)
MCHC: 33.6 g/dL (ref 30.0–36.0)
Platelets: 171 10*3/uL (ref 150–400)

## 2013-01-25 LAB — URINE CULTURE: Culture: NO GROWTH

## 2013-01-25 LAB — ETHYLENE GLYCOL: Ethylene Glycol Lvl: >80

## 2013-01-25 LAB — ALCOHOL, METHYL (METHANOL), BLOOD: Methanol Lvl: NEGATIVE

## 2013-01-25 MED ORDER — HEPARIN SODIUM (PORCINE) 1000 UNIT/ML DIALYSIS
1000.0000 [IU] | INTRAMUSCULAR | Status: DC | PRN
  Filled 2013-01-25: qty 1

## 2013-01-25 MED ORDER — POTASSIUM CHLORIDE 20 MEQ/15ML (10%) PO LIQD
40.0000 meq | Freq: Once | ORAL | Status: AC
  Administered 2013-01-25: 40 meq
  Filled 2013-01-25: qty 30

## 2013-01-25 MED ORDER — SODIUM CHLORIDE 0.9 % IV SOLN
15.0000 mg/kg | Freq: Two times a day (BID) | INTRAVENOUS | Status: DC
  Administered 2013-01-25: 1420 mg via INTRAVENOUS
  Filled 2013-01-25 (×2): qty 1.42

## 2013-01-25 MED ORDER — SODIUM CHLORIDE 0.9 % IV SOLN: 100.0000 mL | INTRAVENOUS | Status: DC | PRN

## 2013-01-25 MED ORDER — PENTAFLUOROPROP-TETRAFLUOROETH EX AERO: 1.0000 "application " | INHALATION_SPRAY | CUTANEOUS | Status: DC | PRN

## 2013-01-25 MED ORDER — ACETAMINOPHEN 500 MG PO TABS
500.0000 mg | ORAL_TABLET | ORAL | Status: DC | PRN
  Administered 2013-01-25 – 2013-02-01 (×7): 500 mg via ORAL
  Filled 2013-01-25 (×6): qty 1

## 2013-01-25 MED ORDER — ACETAMINOPHEN 160 MG/5ML PO SOLN: 500.0000 mg | ORAL | Status: DC | PRN

## 2013-01-25 MED ORDER — NEPRO/CARBSTEADY PO LIQD: 237.0000 mL | ORAL | Status: DC | PRN

## 2013-01-25 MED ORDER — ALTEPLASE 2 MG IJ SOLR
2.0000 mg | Freq: Once | INTRAMUSCULAR | Status: AC | PRN
  Filled 2013-01-25: qty 2

## 2013-01-25 MED ORDER — LIDOCAINE HCL (PF) 1 % IJ SOLN: 5.0000 mL | INTRAMUSCULAR | Status: DC | PRN

## 2013-01-25 MED ORDER — POTASSIUM CHLORIDE 10 MEQ/100ML IV SOLN
10.0000 meq | INTRAVENOUS | Status: AC
  Administered 2013-01-25 (×2): 10 meq via INTRAVENOUS
  Filled 2013-01-25: qty 200

## 2013-01-25 MED ORDER — SODIUM CHLORIDE 0.9 % IV SOLN: 15.0000 mg/kg | Freq: Two times a day (BID) | INTRAVENOUS | Status: DC

## 2013-01-25 MED ORDER — HEPARIN SODIUM (PORCINE) 1000 UNIT/ML DIALYSIS
100.0000 [IU]/kg | INTRAMUSCULAR | Status: DC | PRN
  Filled 2013-01-25: qty 10

## 2013-01-25 MED ORDER — PANTOPRAZOLE SODIUM 40 MG IV SOLR
40.0000 mg | Freq: Two times a day (BID) | INTRAVENOUS | Status: DC
  Administered 2013-01-25 – 2013-01-31 (×12): 40 mg via INTRAVENOUS
  Filled 2013-01-25 (×15): qty 40

## 2013-01-25 MED ORDER — LIDOCAINE-PRILOCAINE 2.5-2.5 % EX CREA: 1.0000 "application " | TOPICAL_CREAM | CUTANEOUS | Status: DC | PRN

## 2013-01-25 MED ORDER — SODIUM CHLORIDE 0.9 % IV SOLN
10.0000 mg/kg | Freq: Once | INTRAVENOUS | Status: AC
  Administered 2013-01-25: 1000 mg via INTRAVENOUS
  Filled 2013-01-25 (×2): qty 1

## 2013-01-25 NOTE — Care Management Note (Signed)
    Page 1 of 1   01/25/2013     11:19:58 AM   CARE MANAGEMENT NOTE 01/25/2013  Patient:  Todd Fitzgerald, Todd Fitzgerald   Account Number:  [id number]  Date Initiated:  01/25/2013  Documentation initiated by:  Avie Arenas  Subjective/Objective Assessment:   Antifreeze ingestion - from group home. Intially on vent - now ext, but needs HD.     Action/Plan:   Anticipated DC Date:  02/01/2013   Anticipated DC Plan:  GROUP HOME  In-house referral  Clinical Social Worker      DC Planning Services  CM consult      Choice offered to / List presented to:             Status of service:  In process, will continue to follow Medicare Important Message given?   (If response is "NO", the following Medicare IM given date fields will be blank) Date Medicare IM given:   Date Additional Medicare IM given:    Discharge Disposition:    Per UR Regulation:  Reviewed for med. necessity/level of care/duration of stay  If discussed at Long Length of Stay Meetings, dates discussed:    Comments:  Contact:  Allen,Amy Daughter [phone]                 [facility] Sister [phone]  01-25-13 11:20am Avie Arenas,  RNBSN [phone] SW consult placed.

## 2013-01-25 NOTE — Progress Notes (Signed)
Subjective:  Events noted.  We were able to get levels back for ethylene glycol.  Pre HD was >80 , post HD was 53.  This AM his osm was more elevated than previous.  He is not acidotic but now creatinine is elevated as well (he did have urinary retention after foley d/cd- now reinserted) Also had hypotension Objective Vital signs in last 24 hours: Filed Vitals:   01/25/13 0658 01/25/13 0705 01/25/13 0706 01/25/13 0720  BP:  108/66 131/66 131/66  Pulse:  83 82   Temp:  98.7 F (37.1 C)    TempSrc:  Oral    Resp:      Height:      Weight: 94.4 kg (208 lb 1.8 oz)     SpO2:       Weight change: -6.391 kg (-14 lb 1.5 oz)  Intake/Output Summary (Last 24 hours) at 01/25/13 0733 Last data filed at 01/25/13 0600  Gross per 24 hour  Intake 2766.47 ml  Output   2266 ml  Net 500.47 ml   Labs: Basic Metabolic Panel:  Lab 01/25/13 1914 01/24/13 2045 01/24/13 1301 01/24/13 0400  NA 141 140 141 --  K 3.2* 3.8 3.2* --  CL 102 98 97 --  CO2 29 31 32 --  GLUCOSE 100* 105* 128* --  BUN 15 11 6  --  CREATININE 3.23* 2.68* 1.84* --  CALCIUM 8.4 8.9 8.8 --  ALB -- -- -- --  PHOS 4.8* -- 3.7 1.0*   Liver Function Tests:  Lab 01/25/13 0402 01/24/13 1345 01/23/13 1450  AST 16 21 19   ALT 9 12 13   ALKPHOS 90 108 108  BILITOT 0.4 0.4 0.2*  PROT 5.9* 6.9 7.3  ALBUMIN 3.1* 3.9 4.0   No results found for this basename: LIPASE:3,AMYLASE:3 in the last 168 hours No results found for this basename: AMMONIA:3 in the last 168 hours CBC:  Lab 01/25/13 0402 01/24/13 2140 01/24/13 1345 01/24/13 0400 01/23/13 1450  WBC 9.8 11.8* 12.7* -- --  NEUTROABS 7.5 -- -- -- --  HGB 11.8* 12.8* 13.7 -- --  HCT 35.7* 38.2* 40.6 -- --  MCV 92.2 93.2 91.4 91.3 94.3  PLT 178 189 198 -- --   Cardiac Enzymes: No results found for this basename: CKTOTAL:5,CKMB:5,CKMBINDEX:5,TROPONINI:5 in the last 168 hours CBG:  Lab 01/24/13 2312 01/24/13 1959 01/24/13 1558 01/24/13 1254 01/24/13 0752  GLUCAP 109* 102* 107*  134* 98    Iron Studies: No results found for this basename: IRON,TIBC,TRANSFERRIN,FERRITIN in the last 72 hours Studies/Results: Dg Chest Port 1 View  01/24/2013  *RADIOLOGY REPORT*  Clinical Data: Ventilator patient.  Intubation.  PORTABLE CHEST - 1 VIEW  Comparison: 01/23/2013.  Findings: 0830 hours.  Endotracheal tube tip is in the mid trachea. There is a right IJ central venous catheter with its tip overlying the upper SVC.  A nasogastric tube projects below the diaphragm. There are slightly lower lung volumes with increased probable bibasilar atelectasis.  No pneumothorax or significant pleural effusion is seen.  The heart size and mediastinal contours are stable allowing for positional differences.  IMPRESSION:  1.  Support system positioned as above. 2.  Mildly increased bibasilar atelectasis.   Original Report Authenticated By: Carey Bullocks, M.D.    Dg Chest Port 1 View  01/23/2013  *RADIOLOGY REPORT*  Clinical Data: Status post intubation and dialysis catheter placement.  PORTABLE CHEST - 1 VIEW  Comparison: None.  Findings: ET tube is in good position with the tip at the level  of the clavicular heads.  Tip of right IJ approach dialysis catheter projects over the upper superior vena cava.  Lungs are clear.  No pneumothorax.  Heart size normal.  IMPRESSION:  1.  ET tube in good position. 2.  Tip of right IJ approach dialysis catheter projects over the upper superior vena cava.  No pneumothorax. 3.  No acute finding.   Original Report Authenticated By: Holley Dexter, M.D.    Medications: Infusions:    . sodium chloride 100 mL/hr at 01/24/13 2142  . pantoprozole (PROTONIX) infusion 8 mg/hr (01/25/13 0022)    Scheduled Medications:    . antiseptic oral rinse  15 mL Mouth Rinse BID  . fomepizole (ANTIZOL) IV  10 mg/kg Intravenous Once  . potassium chloride  10 mEq Intravenous Q1 Hr x 2  . pyridOXINE  100 mg Intravenous Q6H  . thiamine  100 mg Intravenous Q6H    have reviewed  scheduled and prn medications.  Physical Exam: General: alert on vent, nods to questions Heart: RRR Lungs: mostly clear Abdomen: soft, non tender Extremities: minimal edema Dialysis Access: r sided HD cath, placed 1/26  I Assessment/ Plan: Pt is a 52 y.o. yo male who was admitted on 01/23/2013 with confusion and metabolic acidosis/osmolar gap with history of previous episodes of Ethylene glycol ingestion  Assessment/Plan: 1.  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  Patient received a 6 hour dialysis session yest AM and placed back on HD this AM for worsening osm as well as worsening renal function.  He remains on fomepizole.   2. AKI-  Probably multifactorial due to ethylene glycol/urinary retention as well as hypotension which may have caused ATN. UOP seems to be picking up, trying to avoid further episodes of hypotension and keep foley in for now.   3. Hypophosphatemia- resolved. 4. Hypokalemia  - repleting and also 6 hours today on a 4 K bath.       Todd Fitzgerald A   01/25/2013,7:33 AM  LOS: 2 days

## 2013-01-25 NOTE — Progress Notes (Signed)
Spoke with Onalee Hua from [employer] [phone]).  Discussed pts Ethylene Glycol levels since [facility] admission on 1-26:  Ethylene Glycol level on 1-26 @ 2100 prior to dialysis was >80,  Ethylene Glycol level on 1-27 @ 0900 post dialysis was 51. Ethylene Glycol goal is <25.  The decision was made not to initiate dialysis treatment d/t anion gap closed and pt no longer acidotic.  Poison Control reccommended to continue fomepizole drug treatment.

## 2013-01-25 NOTE — Progress Notes (Signed)
Clinical Social Work  CSW received return call from patient's mother. Mom reports that patient was released from group home about 1 week ago and lives with his girlfriend. When asked further questions, mom struggled with providing answers. Mom reports she does not have much interaction with patient and is not sure about his mental illness or care he receives. Mom reports that sister would be best person to contact regarding patient's history. Mom appears confused on phone. Mom verifies several times that we are talking about her son Todd Fitzgerald and not her grandson. Mom hangs up the phone and then calls back and still appears confused. CSW will follow up with sister.  CSW called sister Todd Fitzgerald) and left another message with CSW contact information. CSW will continue to follow.  [place], [place] [phone]

## 2013-01-25 NOTE — Progress Notes (Addendum)
Critical Care Update Progress note:  I spoke with Mr. Davisson mother this PM who called for an update.  I also followed up with [facility], Ms. Berle Mull, in [place] where Mr. Burgher resides but comes home once a month for one week per ACT team recommendations.  At the time of this hospitalization, he was at home with his family.  He apparently left the nursing home on 01/17/13 and was scheduled to return on 01/24/13.  Ms. Audria Nine also confirmed that he receives multiple medications including psychiatric mediations daily that are provided from [facility] at [phone].  These will need to be confirmed by pharmacy and his pcp.    -psychiatry has been consulted this hospital admission and will need to address his polypharmacy and proper medication regimen.  Mr. Briski does confirm having depression but he cannot provide the names of any of the medications he takes nor does he know when he takes them.     -he will remain on suicide precautions pending further evaluation by psychiatry.    Finally, I also spoke to poison control who recommended: follow up ABG s/p HD session today and if pH normalized with ethylene glycol level <25, can d/c fomepizole.    Signed: Darden Palmer, MD PGY-I, Internal Medicine Resident Pager: [phone]  01/25/2013,2:04 PM

## 2013-01-25 NOTE — Procedures (Signed)
Patient was seen on dialysis and the procedure was supervised.  BFR 400  Via vascath BP is  119/71.   Patient appears to be tolerating treatment well  Todd Fitzgerald A 01/25/2013

## 2013-01-25 NOTE — Plan of Care (Signed)
Problem: Phase I Progression Outcomes Goal: Voiding-avoid urinary catheter unless indicated Outcome: Not Applicable Date Met:  01/25/13 Foley dc but had to be reinserted d/t inability to void

## 2013-01-25 NOTE — Progress Notes (Addendum)
Clinical Social Work  CSW spoke with sister who reports that mom gets confused at times and often not able to provide information. Sister reports that patient has been at group home for about 1 year. ([facility] [phone]) Patient was at [facility] after suicide attempt of drinking antifreeze. Patient was admitted to [facility] in Nov 2009 and dc in May 2013. [facility] placed patient in group home. Sister reports that patient was on home visit when he drank the antifreeze. Patient was staying with his girlfriend. Sister reports that patient had not voiced any SI and she is unsure if this was suicide attempt. Sister reports that patient lacks capacity at times and "doesn't realize his actions before he does them."  CSW spoke with [facility] [phone]) who provides ACT services for patient since June 2013. ACT reports that patient is usually compliant with medications but does report that he feels depressed that he lives far away from family. Patient sometimes arrives back at group home later than expected after home visits and has missed medications due to being late. ACT reports patient had not voiced any SI or HI prior to admission.  Patient is prescribed the following medications from ACT: Clozapine 200mg  in AM and 300 mg in PM Haldol 2mg  in AM and 5 mg in PM Trazodone 200 mg PM Lamictal 250mg  PM Emsam patch 6mg  every 12 hours Congentin 3 mg twice daily Topamax 50 mg daily Klonopin .5 mg every 12 hours  ACT reports that patient has guardian as well  Raelyn Mora [phone]  CSW will continue to follow to assist with psych MD recommendations.  [place], [place] [phone]

## 2013-01-25 NOTE — Progress Notes (Signed)
[facility] ADULT ICU REPLACEMENT PROTOCOL FOR AM LAB REPLACEMENT ONLY  The patient does not apply for the [facility] Adult ICU Electrolyte Replacment Protocol based on the criteria listed below:   1. Is GFR >/= 60 ml/min? no  Patient's GFR today is 21 2. K 3.2 3 Dr Deterding notified Ardelle Park 01/25/2013 5:09 AM

## 2013-01-25 NOTE — Progress Notes (Signed)
The patient complains of abdominal pain, 10/10, diffuse; per staff report, his abdomen is distended and tense. Last BM on 01/23/13?  Tylenol for pain and stat KUB ordered. Suspect pain related to constipation, but I will decide on further plan depending on the KUB.

## 2013-01-25 NOTE — Progress Notes (Addendum)
CRITICAL CARE RESIDENT NOTE Interim Progress Note  The patient will be transferred to the 6700 unit today, primary service will be transferred to [employer] I spoke with Junious Silk, NP , who agrees to accept transfer of service, to be resumed tomorrow. PCCM will not be continuing to follow in consult, unless reconsulted.   Signed: Darden Palmer, MD PGY-I, Internal Medicine Resident Pager: [phone]  01/25/2013,2:25 PM

## 2013-01-25 NOTE — Progress Notes (Signed)
Clinical Social Work Department CLINICAL SOCIAL WORK PSYCHIATRY SERVICE LINE ASSESSMENT 01/25/2013  Patient:  Todd Fitzgerald  Account:  [id number]  Admit Date:  01/23/2013  Clinical Social Worker:  Unk Lightning, LCSW  Date/Time:  01/25/2013 11:45 AM Referred by:  Physician  Date referred:  01/25/2013 Reason for Referral  Behavioral Health Issues   Presenting Symptoms/Problems (In the person's/family's own words):   Psych consulted due to patient drinking antifreeze   Abuse/Neglect/Trauma History (check all that apply)  Denies history   Abuse/Neglect/Trauma Comments:   Psychiatric History (check all that apply)  Outpatient treatment  Inpatient/hospitilization   Psychiatric medications:  Cogentin, Clozaril, Haldol, Lamictal, Ativan   Current Mental Health Hospitalizations/Previous Mental Health History:   Patient reports he was seeing a psychiatrist but stopped going to appointments.   Current provider:   None reported   Place and Date:   N/A   Current Medications:   sodium chloride, sodium chloride, acetaminophen (TYLENOL) oral liquid 160 mg/5 mL, alteplase, feeding supplement (NEPRO CARB STEADY), heparin, heparin, heparin, lidocaine, lidocaine-prilocaine, ondansetron (ZOFRAN) IV, pentafluoroprop-tetrafluoroeth                        . antiseptic oral rinse  15 mL Mouth Rinse BID  . fomepizole (ANTIZOL) IV  15 mg/kg Intravenous Q12H  . pyridOXINE  100 mg Intravenous Q6H  . thiamine  100 mg Intravenous Q6H   Previous Impatient Admission/Date/Reason:   Patient reports he stayed at [facility] after previous suicide attempt. Patient cannot remember when he was admitted or how long he stayed.   Emotional Health / Current Symptoms    Suicide/Self Harm  Suicide attempt in past (date/description)   Suicide attempt in the past:   Patient reports he attempted suicide in the past by drinking antifreeze. Patient reports that he drank antifreeze this time but reports that he was  not trying to harm himself. CSW asked patient to describe the difference of why previous time was suicide attempt but this was not. Patient was unable to identify a difference and was unable to state why he drank the antifreeze if he was not attempting suicide.   Other harmful behavior:   None reported.   Psychotic/Dissociative Symptoms  None reported   Other Psychotic/Dissociative Symptoms:    Attention/Behavioral Symptoms  Inattentive   Other Attention / Behavioral Symptoms:    Cognitive Impairment  Unable to accurately assess   Other Cognitive Impairment:    Mood and Adjustment  Guarded  Flat    Stress, Anxiety, Trauma, Any Recent Loss/Stressor  None reported   Anxiety (frequency):   Phobia (specify):   Compulsive behavior (specify):   Obsessive behavior (specify):   Other:   Substance Abuse/Use  None   SBIRT completed (please refer for detailed history):  N  Self-reported substance use:   Patient denies any substance use.   Urinary Drug Screen Completed:  Y Alcohol level:   Negative screen    Environmental/Housing/Living Arrangement  Assisted Living / Group Home   Who is in the home:   [facility]   Emergency contact:  [name]   Financial  IPRS   Patient's Strengths and Goals (patient's own words):   Patient unable to identify any strengths. Patient has stable housing and supportive sister.   Clinical Social Worker's Interpretive Summary:   CSW received referral to complete psychosocial assessment. CSW reviewed chart and met with patient at bedside. Sitter in room.    CSW introduced myself and explained role.  Patient agreeable to assessment. Patient reports he has lived at group home for about 6 months to a year. Prior to group home patient was living with girlfriend. After patient and girlfriend broke up, patient reports "they made me go to the group home." Patient was unable to identify who "they" were. Patient reports he does not like  the group home because he does not like the people there. Patient reports he stays at group home all day and watches tv.    Patient reports he was diagnosed with bipolar. Patient reports that he never feels manic but feels very depressed. Patient states he used to see a psychiatrist but no longer does. Patient is unable to say when he last saw psychiatrist or who he saw. Patient cannot report what medication he takes. Patient continues to deny that drinking antifreeze was a suicide attempt but has no other explanation of why he drank it.    Patient engaged throughout assessment with limited eye contact. Patient is a poor historian and unable to provide adequate information. CSW received permission to call sister [name]). CSW called sister and left a message with CSW contact information. CSW attempted to call group home but number listed stated that voicemail is full. CSW will attempt to get collateral information.    CSW will staff case with psych MD and will follow any recommendations provided.   Disposition:  Recommend Psych CSW continuing to support while in hospital

## 2013-01-25 NOTE — Progress Notes (Signed)
Dialysis initiated using aeseptic technique via R IJ catheter without difficulty. Both ports flush/aspirate easily.  Pt. New to dialysis/no OP POC

## 2013-01-25 NOTE — Progress Notes (Signed)
CRITICAL CARE RESIDENT NOTE Interim Progress Note     RESULT INTERVENTION TAKEN  1. K 3.2  40 meQ x 1, 2 runs x 10  2.    3.      Signed: Annett Gula, MD  PGY-1, Internal Medicine Resident Pager: [phone] (7PM-7AM) 01/25/2013, 4:59 AM

## 2013-01-25 NOTE — Progress Notes (Signed)
PULMONARY  / CRITICAL CARE MEDICINE  Name: Todd Fitzgerald MRN: [id number] DOB: [birth year]    ADMISSION DATE:  01/23/2013 CONSULTATION DATE:  1/26  REFERRING MD :  Dr. Bernette Mayers  CHIEF COMPLAINT:  Antifreeze ingestion  BRIEF PATIENT DESCRIPTION:  52 y.o with self reported 1/2 gallon antifreeze ingestion.  He has had toxic ingestion of antifreeze in the past and multiple suicide attempts.   SIGNIFICANT EVENTS / STUDIES:  1/26 Admitted to ICU 1/27 Emergent HD, extubated  1/28 HD  LINES / TUBES: 1/26-ETT>>1/27 126-foley>>1/27>>1/28 1/26 HD catheter  1/26 NGT  CULTURES: 1/26 MRSA screen  1/26 urine culture   ANTIBIOTICS: none  SUBJECTIVE: Receiving bedside HD, NAD  VITAL SIGNS: Temp:  [97.2 F (36.2 C)-100.3 F (37.9 C)] 98.7 F (37.1 C) (01/28 0705) Pulse Rate:  [78-109] 82  (01/28 0706) Resp:  [11-25] 14  (01/28 0700) BP: (96-131)/(50-74) 120/69 mmHg (01/28 0750) SpO2:  [88 %-98 %] 92 % (01/28 0700) FiO2 (%):  [30 %] 30 % (01/27 1100) Weight:  [208 lb 1.8 oz (94.4 kg)] 208 lb 1.8 oz (94.4 kg) (01/28 0658) HEMODYNAMICS: CVP:  [6 mmHg] 6 mmHg VENTILATOR SETTINGS: Vent Mode:  [-]  FiO2 (%):  [30 %] 30 % INTAKE / OUTPUT: Intake/Output      01/27 0701 - 01/28 0700 01/28 0701 - 01/29 0700   I.V. (mL/kg) 2126 (22.5)    NG/GT 120    IV Piggyback 995.5 100   Total Intake(mL/kg) 3241.5 (34.3) 100 (1.1)   Urine (mL/kg/hr) 966 (0.4)    Emesis/NG output 1300    Other 250    Total Output 2516    Net +725.5 +100        Stool Occurrence 1 x    Emesis Occurrence 1 x      PHYSICAL EXAMINATION: General:  Lying in bed receiving HD Neuro:  AAOX3, perrla b/l HEENT:  [place]/at, PERRLA, EOMI Cardiovascular:  RRR no murmurs or gallops  Lungs:  CTA B/L Abdomen:  Soft, distended, +bs Musculoskeletal:  Warm no cyanosis or edema Skin:  Intact   LABS:  Lab 01/25/13 0402 01/24/13 2140 01/24/13 2045 01/24/13 1705 01/24/13 1417 01/24/13 1345 01/24/13 1301 01/24/13 0925 01/24/13 0800  01/24/13 0407 01/24/13 0400 01/24/13 0325 01/23/13 1450  HGB 11.8* 12.8* -- -- -- 13.7 -- -- -- -- -- -- --  WBC 9.8 11.8* -- -- -- 12.7* -- -- -- -- -- -- --  PLT 178 189 -- -- -- 198 -- -- -- -- -- -- --  NA 141 -- 140 -- -- -- 141 -- -- -- -- -- --  K 3.2* -- 3.8 -- -- -- -- -- -- -- -- -- --  CL 102 -- 98 -- -- -- 97 -- -- -- -- -- --  CO2 29 -- 31 -- -- -- 32 -- -- -- -- -- --  GLUCOSE 100* -- 105* -- -- -- 128* -- -- -- -- -- --  BUN 15 -- 11 -- -- -- 6 -- -- -- -- -- --  CREATININE 3.23* -- 2.68* -- -- -- 1.84* -- -- -- -- -- --  CALCIUM 8.4 -- 8.9 -- -- -- 8.8 -- -- -- -- -- --  MG 2.4 -- -- -- -- -- -- -- -- -- 1.7 -- --  PHOS 4.8* -- -- -- -- -- 3.7 -- -- -- 1.0* -- --  AST 16 -- -- -- -- 21 -- -- -- -- -- -- 19  ALT 9 -- -- -- --  12 -- -- -- -- -- -- 13  ALKPHOS 90 -- -- -- -- 108 -- -- -- -- -- -- 108  BILITOT 0.4 -- -- -- -- 0.4 -- -- -- -- -- -- 0.2*  PROT 5.9* -- -- -- -- 6.9 -- -- -- -- -- -- 7.3  ALBUMIN 3.1* -- -- -- -- 3.9 -- -- -- -- -- -- 4.0  APTT -- -- -- -- -- -- -- -- -- -- -- -- --  INR -- -- -- -- -- 1.04 -- -- -- -- -- -- --  LATICACIDVEN -- -- -- 0.8 -- -- -- 1.9 -- -- -- 1.5 --  TROPONINI -- -- -- -- -- -- -- -- -- -- -- -- --  PROCALCITON -- -- -- -- -- -- -- -- -- -- -- -- --  PROBNP -- -- -- -- -- -- -- -- -- -- -- -- --  O2SATVEN -- -- -- -- -- -- -- -- -- -- -- -- --  PHART -- -- -- -- 7.492* -- -- -- 7.506* 7.557* -- -- --  PCO2ART -- -- -- -- 43.8 -- -- -- 35.7 29.7* -- -- --  PO2ART -- -- -- -- 88.0 -- -- -- 86.2 115.0* -- -- --    Lab 01/24/13 2312 01/24/13 1959 01/24/13 1558 01/24/13 1254 01/24/13 0752  GLUCAP 109* 102* 107* 134* 98   UDS negative  Ethylene glycol >80-->53(post HD) and methanol levels neg Salicylate level <2.0 Acetaminophen <15.0  Alcohol <11  Serum osmolality 380-->302-->401  CXR:  1/27 CXR - no infiltrates, ett wnl , rotated  ASSESSMENT / PLAN:  PULMONARY  Lab 01/24/13 1417 01/24/13 0800 01/24/13 0407 01/23/13  2206 01/23/13 1725  PHART 7.492* 7.506* 7.557* 7.387 7.289*  PCO2ART 43.8 35.7 29.7* 27.2* 20.0*  PO2ART 88.0 86.2 115.0* 149.0* 127.0*  HCO3 33.6* 28.1* 26.4* 16.1* 9.3*  TCO2 35 29.2 27.3 17.0 8.3  O2SAT 97.0 97.2 98.5 97.8 97.9   A: 1) D/C ETT 1/27 for airway protection 2) ? TB status on INH prior to admission - Extubated 1/27, saturating well on RA  P:   F/u home records IS  CARDIOVASCULAR  A: 1) Prolonged QTc (531)--resolved   P:  -monitor qtc -cvp assessment -keep tele  RENAL A:   History of BPH Hypernatremia (152) AKI Creatinine 1.55--improving Acidosis ? Etiology likely 2/2 Ethylene glycol ingestion AG 26-->13 Lactic acidosis 6.0-->4.4  Serum osmolality 380 (high)-->401 Osmolar gap initially 93 (high)-->108 Hematuria   P:   Continue Fomepizole  q 4 hours with HD, post HD per pharmacy, follow level, remains greater 50 Renal following [place]--emergent HD 1/27, continue this AM 6 hours  BMET follow up in am  Thiamine 100 iv qd, B6 50 mg iv qd, Folic acid 50 iv q6 Poison control following  Strict intake and output Ionized calcium 1.07  GASTROINTESTINAL A:  1) Toxic ingestion antifreeze 2) GIB--occult positive NGT secretions  P:   Continue fomepizole with HD IVF NPO Protonix gtt, change to q12h Feed as improved  HEMATOLOGIC A: 1) GIB--monitor HB  P:  Trend cbc Q8h scds Heparin with HD, may ned to dc if bleeding noted  INFECTIOUS A:   No active issues  P:   Urine cx neg  ENDOCRINE A:   No active issues  P:   cbg q4   NEUROLOGIC/PSYCHIATRIC A:   1) Extensive psychiatric hx: schizoaffective disorder, MDD, PTSD, borderline personality disorder with multiple suicide attempts since age 35 2) Agitation 3)  Acute encephalopathy  4) high risk seizures with associated hypocalcemia related to EG  P:   RAAS 0 D/c propofol Pending records to review psychiatric medications  Psych consulted 1/27 Suicide precautions--sitter by  bedside  Global: Mother [phone] Daneil Dan (Sister) [phone] contact  Clinical SUMMARY: 52 y.o with now intubated on propofol with possibly ingestion antifreeze with multiple suicide attempts in the past. Unclear to me if needs HD any more. Weaning vent. Continue fomepizal after HD. Chem in am . To teel, to triadd Call if needed, will sign off   Mcarthur Rossetti. Tyson Alias, MD, FACP Pgr: [phone] [facility]

## 2013-01-26 DIAGNOSIS — F1910 Other psychoactive substance abuse, uncomplicated: Secondary | ICD-10-CM

## 2013-01-26 DIAGNOSIS — T6592XA Toxic effect of unspecified substance, intentional self-harm, initial encounter: Secondary | ICD-10-CM

## 2013-01-26 DIAGNOSIS — T50902A Poisoning by unspecified drugs, medicaments and biological substances, intentional self-harm, initial encounter: Secondary | ICD-10-CM | POA: Diagnosis present

## 2013-01-26 DIAGNOSIS — F339 Major depressive disorder, recurrent, unspecified: Secondary | ICD-10-CM

## 2013-01-26 DIAGNOSIS — F329 Major depressive disorder, single episode, unspecified: Secondary | ICD-10-CM

## 2013-01-26 LAB — CBC
HCT: 34.8 % — ABNORMAL LOW (ref 39.0–52.0)
Hemoglobin: 11.2 g/dL — ABNORMAL LOW (ref 13.0–17.0)
MCH: 31.4 pg (ref 26.0–34.0)
MCV: 91.3 fL (ref 78.0–100.0)
MCV: 91.3 fL (ref 78.0–100.0)
Platelets: 173 10*3/uL (ref 150–400)
RBC: 3.81 MIL/uL — ABNORMAL LOW (ref 4.22–5.81)
RBC: 3.57 MIL/uL — ABNORMAL LOW (ref 4.22–5.81)
WBC: 7.6 10*3/uL (ref 4.0–10.5)
WBC: 7.4 10*3/uL (ref 4.0–10.5)

## 2013-01-26 LAB — GLUCOSE, CAPILLARY
Glucose-Capillary: 100 mg/dL — ABNORMAL HIGH (ref 70–99)
Glucose-Capillary: 119 mg/dL — ABNORMAL HIGH (ref 70–99)
Glucose-Capillary: 112 mg/dL — ABNORMAL HIGH (ref 70–99)
Glucose-Capillary: 99 mg/dL (ref 70–99)
Glucose-Capillary: 120 mg/dL — ABNORMAL HIGH (ref 70–99)

## 2013-01-26 LAB — BASIC METABOLIC PANEL
CO2: 24 mEq/L (ref 19–32)
Glucose, Bld: 106 mg/dL — ABNORMAL HIGH (ref 70–99)
Potassium: 4 mEq/L (ref 3.5–5.1)
Sodium: 138 mEq/L (ref 135–145)

## 2013-01-26 LAB — ETHYLENE GLYCOL: Ethylene Glycol Lvl: <5

## 2013-01-26 LAB — OSMOLALITY
Osmolality: 401 mOsm/kg — ABNORMAL HIGH (ref 275–300)
Osmolality: 285 mOsm/kg (ref 275–300)

## 2013-01-26 MED ORDER — POLYETHYLENE GLYCOL 3350 17 G PO PACK
17.0000 g | PACK | Freq: Two times a day (BID) | ORAL | Status: DC | PRN
  Administered 2013-01-26 – 2013-01-28 (×2): 17 g via ORAL
  Filled 2013-01-26 (×3): qty 1

## 2013-01-26 NOTE — Progress Notes (Signed)
CRITICAL VALUE ALERT  Critical value received: Ethylene Glycol 4484.4  Date of notification:  01/26/13  Time of notification:  230  Critical value read back:yes  Nurse who received alert:  J. Donell Sievert RN  Time of first page:  233  Responding MD:  Frederico Hamman MD  Time MD responded:  233  No new orders at this time. Pt is asymptomatic and no distress noted. Will continue to monitor

## 2013-01-26 NOTE — Progress Notes (Signed)
[facility] PROGRESS NOTE  Todd Fitzgerald:[id number] DOB: 1961-11-11 DOA: 01/23/2013 PCP: No primary provider on file.  Assessment/Plan: 1. 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 Ethylene glycol level pending today. If < 25, will d/c fomepizole; ?annother HD today based on level. Nephrology following, appreciate their input.  2. Suicidal ideation - psychiatry consulted 3. Acute on chronic renal failure - 2/2 ethylene glycol ingestion  Code Status: Full code Family Communication: None (indicate person spoken with, relationship, and if by phone, the number) Disposition Plan: Awaiting psychiatry consult  Consultants:  Psychiatry   Nephrology  Procedures:  none  Antibiotics:  none (indicate start date, and stop date if known)  HPI/Subjective: Mild abdominal pain this morning.   Objective: Filed Vitals:   01/25/13 1709 01/25/13 2046 01/26/13 0503 01/26/13 0735  BP: 120/68 147/75 131/76 129/70  Pulse: 81 82 83 83  Temp: 98.6 F (37 C) 98.5 F (36.9 C) 97.7 F (36.5 C) 98.7 F (37.1 C)  TempSrc: Oral Oral Oral Oral  Resp: 16 18 17 18   Height:      Weight:      SpO2: 98% 94% 95% 96%    Intake/Output Summary (Last 24 hours) at 01/26/13 1148 Last data filed at 01/26/13 0800  Gross per 24 hour  Intake   2675 ml  Output   1200 ml  Net   1475 ml   Filed Weights   01/25/13 0500 01/25/13 0658 01/25/13 1305  Weight: 94.4 kg (208 lb 1.8 oz) 94.4 kg (208 lb 1.8 oz) 94.4 kg (208 lb 1.8 oz)    Exam:   General:  NAD  Cardiovascular: RRR, no MRG  Respiratory: CTA biL  Abdomen: mildly tender to palpation throughout, BS+  Data Reviewed: Basic Metabolic Panel:  Lab 01/26/13 8119 01/25/13 0402 01/24/13 2045 01/24/13 1301 01/24/13 0400  NA 138 141 140 141 147*  K 4.0 3.2* 3.8 3.2* 3.3*  CL 102 102 98 97 106  CO2 24 29 31  32 28  GLUCOSE 106* 100* 105* 128* 93  BUN 15 15 11 6  5*  CREATININE 2.73* 3.23* 2.68* 1.84* 0.84  CALCIUM 8.7 8.4 8.9 8.8 8.3*    MG -- 2.4 -- -- 1.7  PHOS -- 4.8* -- 3.7 1.0*   Liver Function Tests:  Lab 01/25/13 0402 01/24/13 1345 01/23/13 1450  AST 16 21 19   ALT 9 12 13   ALKPHOS 90 108 108  BILITOT 0.4 0.4 0.2*  PROT 5.9* 6.9 7.3  ALBUMIN 3.1* 3.9 4.0   CBC:  Lab 01/26/13 0610 01/25/13 1754 01/25/13 0402 01/24/13 2140 01/24/13 1345  WBC 7.6 9.1 9.8 11.8* 12.7*  NEUTROABS -- -- 7.5 -- --  HGB 11.7* 12.4* 11.8* 12.8* 13.7  HCT 34.8* 36.9* 35.7* 38.2* 40.6  MCV 91.3 93.4 92.2 93.2 91.4  PLT 173 171 178 189 198   CBG:  Lab 01/26/13 0734 01/26/13 0411 01/26/13 0011 01/25/13 2042 01/25/13 1619  GLUCAP 119* 115* 100* 129* 74    Recent Results (from the past 240 hour(s))  MRSA PCR SCREENING     Status: Normal   Collection Time   01/23/13  7:45 PM      Component Value Range Status Comment   MRSA by PCR NEGATIVE  NEGATIVE Final   URINE CULTURE     Status: Normal   Collection Time   01/23/13  7:46 PM      Component Value Range Status Comment   Specimen Description URINE, CLEAN CATCH   Final  Special Requests NONE   Final    Culture  Setup Time 01/24/2013 01:22   Final    Colony Count NO GROWTH   Final    Culture NO GROWTH   Final    Report Status 01/25/2013 FINAL   Final      Studies: Dg Abd 2 Views  01/25/2013  *RADIOLOGY REPORT*  Clinical Data: Abdominal pain and distension.  ABDOMEN - 2 VIEW  Comparison: 10/22/2008  Findings: There is a moderate to large stool burden.  Flecks of high attenuation may reflect ingested debris within the colon. Relative paucity of small bowel gas.  No free intraperitoneal air. No acute osseous finding.  IMPRESSION: Moderate to large stool burden.  Relative paucity of small bowel gas may reflect decompressed or fluid filled loops.   Original Report Authenticated By: Jearld Lesch, M.D.     Scheduled Meds:   . antiseptic oral rinse  15 mL Mouth Rinse BID  . pantoprazole (PROTONIX) IV  40 mg Intravenous Q12H  . pyridOXINE  100 mg Intravenous Q6H  . thiamine   100 mg Intravenous Q6H   Continuous Infusions:   . sodium chloride 100 mL/hr at 01/26/13 1042    Principal Problem:  *Antifreeze poisoning Active Problems:  Acute respiratory failure  Hypernatremia  AKI (acute kidney injury)  Lactic acidosis  Hematuria  [name]  [facility] Pager [phone]. If 7PM-7AM, please contact night-coverage at [email or web address], password [facility] 01/26/2013, 11:48 AM  LOS: 3 days

## 2013-01-26 NOTE — Consult Note (Signed)
Patient Identification:  Todd Fitzgerald Date of Evaluation:  01/26/2013 Reason for Consult: Drank Antifreeze  Referring Provider: Elvera Lennox  History of Present Illness: Pt says he is wanting to 'end it'.  He says he fears his GF of 20 years has left him.  He offers no other history.  He denies he was drinking or seeking alcohol at the time.   Past Psychiatric History:pt denies alcohol except for beginning to drink alcohol at age 52 and quit 'a while ago', and smokes cannabis; smokes cigarettes 1/2 ppd.  Pt has been at [facility] and [facility] multiple times; has a repeated history of attempted suicide since age 83 and more recently four attempts of overdose with antifreeze.  He does not offer reason for wanting to die.   Past Medical History:     Past Medical History  Diagnosis Date  . Suicidal ideation     overdose drugs age 8, last 7 years antifreeze ingestion x 4 times  . CKD (chronic kidney disease)     questionable  . Heart murmur   . MDD (major depressive disorder)   . ED (erectile dysfunction)   . BPH (benign prostatic hyperplasia)   . PTSD (post-traumatic stress disorder)   . HLD (hyperlipidemia)   . Borderline personality disorder   . Schizoaffective disorder   . Ruptured disc, cervical       History reviewed. No pertinent past surgical history.  Allergies: No Known Allergies  Current Medications:  Prior to Admission medications   Medication Sig Start Date End Date Taking? Authorizing Provider  aspirin EC 81 MG tablet Take 81 mg by mouth at bedtime.   Yes Historical Provider, MD  atropine 1 % ophthalmic solution Place 2 drops under the tongue at bedtime.   Yes Historical Provider, MD  benztropine (COGENTIN) 1 MG tablet Take 3 mg by mouth 2 (two) times daily.   Yes Historical Provider, MD  cholecalciferol (VITAMIN D) 400 UNITS TABS Take 800 Units by mouth at bedtime.    Yes Historical Provider, MD  cloZAPine (CLOZARIL) 100 MG tablet Take 200-300 mg by mouth 2 (two) times  daily. 200mg  in the morning; 300mg  in the evening   Yes Historical Provider, MD  fish oil-omega-3 fatty acids 1000 MG capsule Take 1 g by mouth at bedtime.    Yes Historical Provider, MD  gemfibrozil (LOPID) 600 MG tablet Take 600 mg by mouth 2 (two) times daily.   Yes Historical Provider, MD  haloperidol (HALDOL) 2 MG tablet Take 2 mg by mouth every morning.   Yes Historical Provider, MD  haloperidol (HALDOL) 5 MG tablet Take 5 mg by mouth daily. 5 PM   Yes Historical Provider, MD  hydrOXYzine (ATARAX/VISTARIL) 50 MG tablet Take 100 mg by mouth 2 (two) times daily as needed. For anxiety   Yes Historical Provider, MD  isoniazid (NYDRAZID) 100 MG tablet Take 300 mg by mouth daily. on an empty stomach daily   Yes Historical Provider, MD  lamoTRIgine (LAMICTAL) 100 MG tablet Take 250 mg by mouth daily.    Yes Historical Provider, MD  LORazepam (ATIVAN) 0.5 MG tablet Take 0.5 mg by mouth 2 (two) times daily.    Yes Historical Provider, MD  polyethylene glycol (MIRALAX / GLYCOLAX) packet Take 17 g by mouth daily.   Yes Historical Provider, MD  pyridOXINE (VITAMIN B-6) 100 MG tablet Take 50 mg by mouth daily.   Yes Historical Provider, MD  ranitidine (ZANTAC) 150 MG tablet Take 150 mg by mouth 2 (  two) times daily.   Yes Historical Provider, MD  selegiline (EMSAM) 12 MG/24HR Place 1 patch onto the skin daily.   Yes Historical Provider, MD  senna (SENOKOT) 8.6 MG tablet Take 2 tablets by mouth 2 (two) times daily.    Yes Historical Provider, MD  simvastatin (ZOCOR) 20 MG tablet Take 20 mg by mouth daily.   Yes Historical Provider, MD  Tamsulosin HCl (FLOMAX) 0.4 MG CAPS Take 0.4 mg by mouth daily after breakfast.   Yes Historical Provider, MD  topiramate (TOPAMAX) 50 MG tablet Take 50 mg by mouth at bedtime.   Yes Historical Provider, MD  traZODone (DESYREL) 100 MG tablet Take 200 mg by mouth at bedtime.    Yes Historical Provider, MD  vardenafil (LEVITRA) 10 MG tablet Take 10 mg by mouth as needed. For  erectile dysfunction   Yes Historical Provider, MD    Social History:    reports that he has been smoking Cigarettes.  He has a 2.5 pack-year smoking history. He does not have any smokeless tobacco history on file. He reports that he uses illicit drugs (Marijuana) about 5 times per week. He reports that he does not drink alcohol.   Family History:    History reviewed. No pertinent family history.  Mental Status Examination/Evaluation: Objective:  Appearance: Casual and obese  Eye Contact::  Good  Speech:  Clear and Coherent and limited responses; non-organized history   Volume:  Normal  Mood:  depressed  Affect:  Blunt, Congruent and Depressed  Thought Process:  Disorganized and guarded  Orientation:  Full (Time, Place, and Person) only  Thought Content:  Rumination and loss of GF; no reason given  Suicidal Thoughts:  Yes.  with intent/plan  Homicidal Thoughts:  No  Judgement:  Impaired  Insight:  Lacking   DIAGNOSIS:   AXIS I  Major Depression with suicide attempt; Polysusbstance abuse  AXIS II  Borderline Personality Disorder with repeated suicide attempts  AXIS III See medical notes.  AXIS IV economic problems, other psychosocial or environmental problems, problems related to social environment, problems with primary support group and pt alludes to collapse of communication with all relatives, incl GF and children   AXIS V 41-50 serious symptoms compulsive suicide attempts; repeated BHH abd [place]tner satys   Assessment/Plan:  Discussed with Dr. Elvera Lennox [phone]  Pt has chronic hx of suicide attempts.  He gives no reason for buying ethylene alcohol, a repeated behavior.  He is essentially non-verbal, a poor historian. Chronic hx of psychiatric inpatient admissions is documented.   He says he had employment and was given disability for inability to concentrated.  He complains of  Abdominal pain.  He does not elaborate about suicide attempt - just wanted to die.  His inability to  provide organized histroy begs question of cognitive function either conginital or due to multiple attempts of suicide.  Question if pt has cognitive function and interest in therapy.      NOTED:  Pt has documented Rx of Clozaril - probably helpful; known to decrease suicide attempts [if adherent].  It is important to determine how recently he has been taking the dose.- will need CBC/WBC if considering to initiate again.  RECOMMENDATION:  1.  Pt has limited capacity to explain treatment; understand harmful effects of repeated ethylene glycol ingestion.  2.  Will request permission to contact GF or relatives for collateral information.  3.  No Rx recommendations until abnormal labs are corrected 4.  Will follow pt.  Lesly Pontarelli  MD 01/26/2013 12:57 PM

## 2013-01-26 NOTE — Progress Notes (Signed)
Subjective:   Ethylene glycol level that was obtained pre HD yesterday was noted to be 23.  He is not significantly acidotic this AM, osm is pending. Making good urine but creatinine is still elevated .   Objective Vital signs in last 24 hours: Filed Vitals:   02-05-13 1400 02/05/2013 1709 02/05/13 2046 01/26/13 0503  BP: 117/69 120/68 147/75 131/76  Pulse: 85 81 82 83  Temp:  98.6 F (37 C) 98.5 F (36.9 C) 97.7 F (36.5 C)  TempSrc:  Oral Oral Oral  Resp: 14 16 18 17   Height:      Weight:      SpO2: 95% 98% 94% 95%   Weight change: 1 kg (2 lb 3.3 oz)  Intake/Output Summary (Last 24 hours) at 01/26/13 0847 Last data filed at 01/26/13 0700  Gross per 24 hour  Intake   2791 ml  Output   1200 ml  Net   1591 ml   Labs: Basic Metabolic Panel:  Lab 01/26/13 1610 02-05-2013 0402 01/24/13 2045 01/24/13 1301 01/24/13 0400  NA 138 141 140 -- --  K 4.0 3.2* 3.8 -- --  CL 102 102 98 -- --  CO2 24 29 31  -- --  GLUCOSE 106* 100* 105* -- --  BUN 15 15 11  -- --  CREATININE 2.73* 3.23* 2.68* -- --  CALCIUM 8.7 8.4 8.9 -- --  ALB -- -- -- -- --  PHOS -- 4.8* -- 3.7 1.0*   Liver Function Tests:  Lab 2013-02-05 0402 01/24/13 1345 01/23/13 1450  AST 16 21 19   ALT 9 12 13   ALKPHOS 90 108 108  BILITOT 0.4 0.4 0.2*  PROT 5.9* 6.9 7.3  ALBUMIN 3.1* 3.9 4.0   No results found for this basename: LIPASE:3,AMYLASE:3 in the last 168 hours No results found for this basename: AMMONIA:3 in the last 168 hours CBC:  Lab 01/26/13 0610 02-05-13 1754 05-Feb-2013 0402 01/24/13 2140 01/24/13 1345  WBC 7.6 9.1 9.8 -- --  NEUTROABS -- -- 7.5 -- --  HGB 11.7* 12.4* 11.8* -- --  HCT 34.8* 36.9* 35.7* -- --  MCV 91.3 93.4 92.2 93.2 91.4  PLT 173 171 178 -- --   Cardiac Enzymes: No results found for this basename: CKTOTAL:5,CKMB:5,CKMBINDEX:5,TROPONINI:5 in the last 168 hours CBG:  Lab 01/26/13 0734 01/26/13 0411 01/26/13 0011 05-Feb-2013 2042 02/05/13 1619  GLUCAP 119* 115* 100* 129* 74    Iron  Studies: No results found for this basename: IRON,TIBC,TRANSFERRIN,FERRITIN in the last 72 hours Studies/Results: Dg Abd 2 Views  2013-02-05  *RADIOLOGY REPORT*  Clinical Data: Abdominal pain and distension.  ABDOMEN - 2 VIEW  Comparison: 10/22/2008  Findings: There is a moderate to large stool burden.  Flecks of high attenuation may reflect ingested debris within the colon. Relative paucity of small bowel gas.  No free intraperitoneal air. No acute osseous finding.  IMPRESSION: Moderate to large stool burden.  Relative paucity of small bowel gas may reflect decompressed or fluid filled loops.   Original Report Authenticated By: Jearld Lesch, M.D.    Medications: Infusions:    . sodium chloride 100 mL/hr at 2013-02-05 1802    Scheduled Medications:    . antiseptic oral rinse  15 mL Mouth Rinse BID  . pantoprazole (PROTONIX) IV  40 mg Intravenous Q12H  . pyridOXINE  100 mg Intravenous Q6H  . thiamine  100 mg Intravenous Q6H    have reviewed scheduled and prn medications.  Physical Exam: General: awake, complaining of pain.  Not particularly interested in what I am telling him about his kidneys Heart: RRR Lungs: mostly clear Abdomen: soft, non tender Extremities: minimal edema Dialysis Access: r sided HD cath, placed 1/26  I Assessment/ Plan: Pt is a 52 y.o. yo male who was admitted on 01/23/2013 with confusion and metabolic acidosis/osmolar gap with history of previous episodes of Ethylene glycol ingestion  Assessment/Plan: 1.  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  Patient received a 6 hour dialysis session Monday and Tuesday for worsening osm as well as worsening renal function. His level pre HD yest was 23 so dialysis should have taken in even lower.  He remains on fomepizole.  Will check a level today, hopefully should not need any further HD 2. AKI-  Probably multifactorial due to ethylene glycol/urinary retention as well as hypotension which may  have caused ATN. UOP seems to be picking up, trying to avoid further episodes of hypotension and keep foley in for now. He may have done some permanent damage with this latest episode.  As ablove, he does not seem too concerned.   3. Hypophosphatemia- resolved. 4. Hypokalemia  - resolved      Malikye Reppond A   01/26/2013,8:47 AM  LOS: 3 days

## 2013-01-26 NOTE — Progress Notes (Signed)
Clinical Social Work Progress Note PSYCHIATRY SERVICE LINE 01/26/2013  Patient:  Todd Fitzgerald  Account:  [id number]  Admit Date:  01/23/2013  Clinical Social Worker:  Unk Lightning, LCSW  Date/Time:  01/26/2013 10:45 AM  Review of Patient  Overall Medical Condition:   Per chart review, medical workup still occurring.   Participation Level:  Active  Participation Quality  Guarded   Other Participation Quality:   Affect  Flat   Cognitive  Alert   Reaction to Medications/Concerns:   None reported   Modes of Intervention  Support  Confrontation  Clarification   Summary of Progress/Plan at Discharge   CSW met with patient at bedside. No visitors present but sitter in room. Patient just finished taking a bath and sitting in chair watching tv.    Patient remembered CSW from previous visit and agreeable to session. Patient reports he is still having stomach pain this morning and does not feel well. CSW informed patient that CSW spoke with mom and sister. Patient expressed concern for mom and reports that she has not been doing well. CSW allowed patient time to voice concerns about his mother's health and not being able to visit her often. Patient felt reassured that sister is assisting mom with needs.    CSW spoke with patient regarding drinking the antifreeze and his emotional state today. Patient reports he remains depressed. Patient states that he was not trying to kill or hurt himself when he drank the antifreeze. Patient unable to give direct answer of why he drank antifreeze. Patient reports he was enjoying being at home and did not want to return to the group home. CSW asked patient how drinking the antifreeze would keep him at home. Patient was unable to answer this question.    Patient denies any SI or HI at this time. Patient denies any psychotic features. Patient reports he still feels depressed today and does not want to return to the group home. Patient inquires about his  disposition. CSW explained psych MD will assess patient and guardian will be contacted.  CSW and patient had a long discussion on patient's decisions and the impacts that his decisions have on him. Patient struggles with processing this conversation and is unable to use problem solving skills when CSW asked patient to identify how he could have handled the situation differently. Patient has poor judgment skills and is impulsive.    CSW spoke with group home who reports that patient often voices feelings of depression but never voices SI or HI. Group home states that patient often complains of stomach pain when he returns from home visits. Patient is not aggressive at the group home and participates in social outings. Group home is agreeable to accept patient back if appropriate at dc.    CSW spoke with guardian (Mr. Manson Passey) who is patient's interim guardian. The court system appointed patient a guardian who recently left the agency so Mr. Manson Passey has been filling in. Guardian will be kept updated on dc plans.    CSW will continue to follow and will assist with recommendations provided by psych MD.

## 2013-01-27 LAB — CBC
Hemoglobin: 11.4 g/dL — ABNORMAL LOW (ref 13.0–17.0)
MCH: 30.8 pg (ref 26.0–34.0)
MCH: 31.5 pg (ref 26.0–34.0)
MCHC: 34.6 g/dL (ref 30.0–36.0)
MCV: 91.1 fL (ref 78.0–100.0)
Platelets: 175 10*3/uL (ref 150–400)
Platelets: 181 10*3/uL (ref 150–400)
RBC: 3.7 MIL/uL — ABNORMAL LOW (ref 4.22–5.81)
RBC: 3.49 MIL/uL — ABNORMAL LOW (ref 4.22–5.81)
RDW: 12.7 % (ref 11.5–15.5)
WBC: 7.4 10*3/uL (ref 4.0–10.5)

## 2013-01-27 LAB — BASIC METABOLIC PANEL
GFR calc non Af Amer: 20 mL/min — ABNORMAL LOW (ref >90)
Glucose, Bld: 114 mg/dL — ABNORMAL HIGH (ref 70–99)
Potassium: 4.1 mEq/L (ref 3.5–5.1)
Sodium: 138 mEq/L (ref 135–145)

## 2013-01-27 LAB — GLUCOSE, CAPILLARY
Glucose-Capillary: 160 mg/dL — ABNORMAL HIGH (ref 70–99)
Glucose-Capillary: 120 mg/dL — ABNORMAL HIGH (ref 70–99)

## 2013-01-27 MED ORDER — SODIUM CHLORIDE 0.9 % IJ SOLN
10.0000 mL | INTRAMUSCULAR | Status: DC | PRN
Start: 2013-01-27 — End: 2013-02-04
  Administered 2013-01-27 – 2013-01-31 (×6): 10 mL

## 2013-01-27 NOTE — Progress Notes (Signed)
[facility] PROGRESS NOTE  Todd Fitzgerald:[id number] DOB: 1961-03-30 DOA: 01/23/2013 PCP: No primary provider on file.  Assessment/Plan: 1. 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 Ethylene glycol level better now. 2. Suicidal ideation - psychiatry consulted, appreciate their plan regarding safety following his recurrent ingestions ad well as medications options.  3. Acute on chronic renal failure - 2/2 ethylene glycol ingestion, Cr a bit worse today, will continue to monitor. Making good urine.    Code Status: Full code Family Communication: None (indicate person spoken with, relationship, and if by phone, the number) Disposition Plan: Unknown   Consultants:  Psychiatry   Nephrology  Procedures:  none  Antibiotics:  none   HPI/Subjective: - continue to have mild abdominal pain, had a BM yesterday   Objective: Filed Vitals:   01/26/13 1747 01/26/13 2110 01/27/13 0627 01/27/13 0731  BP: 138/85 112/57 145/78 139/68  Pulse: 73 77 81 79  Temp: 98.3 F (36.8 C) 98.1 F (36.7 C) 98.7 F (37.1 C) 98.3 F (36.8 C)  TempSrc: Oral Oral Oral   Resp: 18 18 16 18   Height:  5\' 8"  (1.727 m)    Weight:  94.399 kg (208 lb 1.8 oz)    SpO2: 98% 97% 96% 98%    Intake/Output Summary (Last 24 hours) at 01/27/13 1237 Last data filed at 01/27/13 1126  Gross per 24 hour  Intake   4245 ml  Output   4250 ml  Net     -5 ml   Filed Weights   01/25/13 0658 01/25/13 1305 01/26/13 2110  Weight: 94.4 kg (208 lb 1.8 oz) 94.4 kg (208 lb 1.8 oz) 94.399 kg (208 lb 1.8 oz)    Exam:   General:  NAD  Cardiovascular: RRR, no MRG  Respiratory: CTA biL  Abdomen: mildly tender to palpation throughout, BS+  Data Reviewed: Basic Metabolic Panel:  Lab 01/27/13 8119 01/26/13 0610 01/25/13 0402 01/24/13 2045 01/24/13 1301 01/24/13 0400  NA 138 138 141 140 141 --  K 4.1 4.0 3.2* 3.8 3.2* --  CL 106 102 102 98 97 --  CO2 23 24 29 31  32 --  GLUCOSE 114* 106* 100* 105* 128* --   BUN 25* 15 15 11 6  --  CREATININE 3.30* 2.73* 3.23* 2.68* 1.84* --  CALCIUM 9.0 8.7 8.4 8.9 8.8 --  MG -- -- 2.4 -- -- 1.7  PHOS -- -- 4.8* -- 3.7 1.0*   Liver Function Tests:  Lab 01/25/13 0402 01/24/13 1345 01/23/13 1450  AST 16 21 19   ALT 9 12 13   ALKPHOS 90 108 108  BILITOT 0.4 0.4 0.2*  PROT 5.9* 6.9 7.3  ALBUMIN 3.1* 3.9 4.0   CBC:  Lab 01/27/13 0545 01/26/13 1726 01/26/13 0610 01/25/13 1754 01/25/13 0402  WBC 7.4 7.4 7.6 9.1 9.8  NEUTROABS -- -- -- -- 7.5  HGB 11.4* 11.2* 11.7* 12.4* 11.8*  HCT 33.4* 32.6* 34.8* 36.9* 35.7*  MCV 90.3 91.3 91.3 93.4 92.2  PLT 175 171 173 171 178   CBG:  Lab 01/27/13 1148 01/27/13 0729 01/27/13 0340 01/26/13 2358 01/26/13 2112  GLUCAP 154* 127* 160* 104* 120*    Recent Results (from the past 240 hour(s))  MRSA PCR SCREENING     Status: Normal   Collection Time   01/23/13  7:45 PM      Component Value Range Status Comment   MRSA by PCR NEGATIVE  NEGATIVE Final   URINE CULTURE     Status: Normal  Collection Time   01/23/13  7:46 PM      Component Value Range Status Comment   Specimen Description URINE, CLEAN CATCH   Final    Special Requests NONE   Final    Culture  Setup Time 01/24/2013 01:22   Final    Colony Count NO GROWTH   Final    Culture NO GROWTH   Final    Report Status 01/25/2013 FINAL   Final      Studies: Dg Abd 2 Views  01/25/2013  *RADIOLOGY REPORT*  Clinical Data: Abdominal pain and distension.  ABDOMEN - 2 VIEW  Comparison: 10/22/2008  Findings: There is a moderate to large stool burden.  Flecks of high attenuation may reflect ingested debris within the colon. Relative paucity of small bowel gas.  No free intraperitoneal air. No acute osseous finding.  IMPRESSION: Moderate to large stool burden.  Relative paucity of small bowel gas may reflect decompressed or fluid filled loops.   Original Report Authenticated By: Jearld Lesch, M.D.     Scheduled Meds:    . pantoprazole (PROTONIX) IV  40 mg  Intravenous Q12H   Continuous Infusions:    . sodium chloride 100 mL/hr at 01/27/13 0908    Principal Problem:  *Antifreeze poisoning Active Problems:  Acute respiratory failure  Hypernatremia  AKI (acute kidney injury)  Lactic acidosis  Hematuria  Suicide attempt by drug ingestion  [name]  [facility] Pager [phone]. If 7PM-7AM, please contact night-coverage at [email or web address], password [facility] 01/27/2013, 12:37 PM  LOS: 4 days

## 2013-01-27 NOTE — Progress Notes (Signed)
Subjective:   Ethylene glycol level that was obtained  yesterday was noted to be less than 5.   He has not required dialysis after 01/26/2023. . Making good urine but creatinine is still elevated and did rise from yesterday.  He essentially is nonverbal, very flat.    Objective Vital signs in last 24 hours: Filed Vitals:   01/26/13 1747 01/26/13 2110 01/27/13 0627 01/27/13 0731  BP: 138/85 112/57 145/78 139/68  Pulse: 73 77 81 79  Temp: 98.3 F (36.8 C) 98.1 F (36.7 C) 98.7 F (37.1 C) 98.3 F (36.8 C)  TempSrc: Oral Oral Oral   Resp: 18 18 16 18   Height:  5\' 8"  (1.727 m)    Weight:  94.399 kg (208 lb 1.8 oz)    SpO2: 98% 97% 96% 98%   Weight change: -0.001 kg (-0 oz)  Intake/Output Summary (Last 24 hours) at 01/27/13 0845 Last data filed at 01/27/13 1610  Gross per 24 hour  Intake   3765 ml  Output   3650 ml  Net    115 ml   Labs: Basic Metabolic Panel:  Lab 01/27/13 9604 01/26/13 0610 January 26, 2013 0402 01/24/13 1301 01/24/13 0400  NA 138 138 141 -- --  K 4.1 4.0 3.2* -- --  CL 106 102 102 -- --  CO2 23 24 29  -- --  GLUCOSE 114* 106* 100* -- --  BUN 25* 15 15 -- --  CREATININE 3.30* 2.73* 3.23* -- --  CALCIUM 9.0 8.7 8.4 -- --  ALB -- -- -- -- --  PHOS -- -- 4.8* 3.7 1.0*   Liver Function Tests:  Lab Jan 26, 2013 0402 01/24/13 1345 01/23/13 1450  AST 16 21 19   ALT 9 12 13   ALKPHOS 90 108 108  BILITOT 0.4 0.4 0.2*  PROT 5.9* 6.9 7.3  ALBUMIN 3.1* 3.9 4.0   No results found for this basename: LIPASE:3,AMYLASE:3 in the last 168 hours No results found for this basename: AMMONIA:3 in the last 168 hours CBC:  Lab 01/27/13 0545 01/26/13 1726 01/26/13 0610 26-Jan-2013 1754 26-Jan-2013 0402  WBC 7.4 7.4 7.6 -- --  NEUTROABS -- -- -- -- 7.5  HGB 11.4* 11.2* 11.7* -- --  HCT 33.4* 32.6* 34.8* -- --  MCV 90.3 91.3 91.3 93.4 92.2  PLT 175 171 173 -- --   Cardiac Enzymes: No results found for this basename: CKTOTAL:5,CKMB:5,CKMBINDEX:5,TROPONINI:5 in the last 168 hours CBG:  Lab  01/27/13 0729 01/27/13 0340 01/26/13 2358 01/26/13 2112 01/26/13 1654  GLUCAP 127* 160* 104* 120* 112*    Iron Studies: No results found for this basename: IRON,TIBC,TRANSFERRIN,FERRITIN in the last 72 hours Studies/Results: Dg Abd 2 Views  January 26, 2013  *RADIOLOGY REPORT*  Clinical Data: Abdominal pain and distension.  ABDOMEN - 2 VIEW  Comparison: 10/22/2008  Findings: There is a moderate to large stool burden.  Flecks of high attenuation may reflect ingested debris within the colon. Relative paucity of small bowel gas.  No free intraperitoneal air. No acute osseous finding.  IMPRESSION: Moderate to large stool burden.  Relative paucity of small bowel gas may reflect decompressed or fluid filled loops.   Original Report Authenticated By: Jearld Lesch, M.D.    Medications: Infusions:    . sodium chloride 100 mL/hr at 01/26/13 2113    Scheduled Medications:    . pantoprazole (PROTONIX) IV  40 mg Intravenous Q12H  . pyridOXINE  100 mg Intravenous Q6H  . thiamine  100 mg Intravenous Q6H    have reviewed scheduled and  prn medications.  Physical Exam: General: awake, complaining of pain.  Not particularly interested in what I am telling him about his kidneys Heart: RRR Lungs: mostly clear Abdomen: soft, non tender Extremities: minimal edema Dialysis Access: r sided HD cath, placed 1/26  I Assessment/ Plan: Pt is a 52 y.o. yo male who was admitted on 01/23/2013 with confusion and metabolic acidosis/osmolar gap with history of previous episodes of Ethylene glycol ingestion  Assessment/Plan: 1.  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  Patient received a 6 hour dialysis session Monday and Tuesday for worsening osm as well as worsening renal function. His level is now undetectable.  Does not need any further HD for clearance of ethylene glycol but now has acute on chronic renal failure although is nonoliguric maybe is progressing ?  2. AKI-  Probably  multifactorial due to ethylene glycol/urinary retention as well as transient hypotension which may have caused ATN. UOP seems to be picking up, trying to avoid further episodes of hypotension and keep foley in for now. He may have done some permanent renal damage with this latest episode.   Will continue to support with IVF to "flush" the crystals and hope that this is reversible.  He cannot go to [facility] (probably where he needs to be ) with these issues ongoing As above, he does not seem too concerned.   3. Hypophosphatemia- resolved. 4. Hypokalemia  - resolved      Conway Fedora A   01/27/2013,8:45 AM  LOS: 4 days

## 2013-01-27 NOTE — Consult Note (Signed)
Patient Identification:  Todd Fitzgerald Date of Evaluation:  01/27/2013 Reason for Consult:  Drank Antifreeze  Referring Provider:  Dr. [name]  History of Present Illness:Pt lives in group home and was given 4-wk leave.  He drank antifreeze and cannot say why.   Past Psychiatric History:  Other psychiatric admissions.  He has drank antifreeze at least 3 time and cannot say he was 'looking for alcohol', or 'wanting to die'.   He is in a group home but unable to say for how long. He says the members tease him.   Past Medical History:     Past Medical History  Diagnosis Date  . 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 CKD (chronic kidney disease)     questionable  . Heart murmur   . MDD (major depressive disorder)   . ED (erectile dysfunction)   . BPH (benign prostatic hyperplasia)   . PTSD (post-traumatic stress disorder)   . HLD (hyperlipidemia)   . Borderline personality disorder   . Schizoaffective disorder   . Ruptured disc, cervical       History reviewed. No pertinent past surgical history.  Allergies: No Known Allergies  Current Medications:  Prior to Admission medications   Medication Sig Start Date End Date Taking? Authorizing Provider  aspirin EC 81 MG tablet Take 81 mg by mouth at bedtime.   Yes Historical Provider, MD  atropine 1 % ophthalmic solution Place 2 drops under the tongue at bedtime.   Yes Historical Provider, MD  benztropine (COGENTIN) 1 MG tablet Take 3 mg by mouth 2 (two) times daily.   Yes Historical Provider, MD  cholecalciferol (VITAMIN D) 400 UNITS TABS Take 800 Units by mouth at bedtime.    Yes Historical Provider, MD  cloZAPine (CLOZARIL) 100 MG tablet Take 200-300 mg by mouth 2 (two) times daily. 200mg  in the morning; 300mg  in the evening   Yes Historical Provider, MD  fish oil-omega-3 fatty acids 1000 MG capsule Take 1 g by mouth at bedtime.    Yes Historical Provider, MD  gemfibrozil (LOPID) 600  MG tablet Take 600 mg by mouth 2 (two) times daily.   Yes Historical Provider, MD  haloperidol (HALDOL) 2 MG tablet Take 2 mg by mouth every morning.   Yes Historical Provider, MD  haloperidol (HALDOL) 5 MG tablet Take 5 mg by mouth daily. 5 PM   Yes Historical Provider, MD  hydrOXYzine (ATARAX/VISTARIL) 50 MG tablet Take 100 mg by mouth 2 (two) times daily as needed. For anxiety   Yes Historical Provider, MD  isoniazid (NYDRAZID) 100 MG tablet Take 300 mg by mouth daily. on an empty stomach daily   Yes Historical Provider, MD  lamoTRIgine (LAMICTAL) 100 MG tablet Take 250 mg by mouth daily.    Yes Historical Provider, MD  LORazepam (ATIVAN) 0.5 MG tablet Take 0.5 mg by mouth 2 (two) times daily.    Yes Historical Provider, MD  polyethylene glycol (MIRALAX / GLYCOLAX) packet Take 17 g by mouth daily.   Yes Historical Provider, MD  pyridOXINE (VITAMIN B-6) 100 MG tablet Take 50 mg by mouth daily.   Yes Historical Provider, MD  ranitidine (ZANTAC) 150 MG tablet Take 150 mg by mouth 2 (two) times daily.   Yes Historical Provider, MD  selegiline (EMSAM) 12 MG/24HR Place 1 patch onto the skin daily.   Yes Historical Provider, MD  senna (SENOKOT) 8.6 MG tablet Take 2 tablets  by mouth 2 (two) times daily.    Yes Historical Provider, MD  simvastatin (ZOCOR) 20 MG tablet Take 20 mg by mouth daily.   Yes Historical Provider, MD  Tamsulosin HCl (FLOMAX) 0.4 MG CAPS Take 0.4 mg by mouth daily after breakfast.   Yes Historical Provider, MD  topiramate (TOPAMAX) 50 MG tablet Take 50 mg by mouth at bedtime.   Yes Historical Provider, MD  traZODone (DESYREL) 100 MG tablet Take 200 mg by mouth at bedtime.    Yes Historical Provider, MD  vardenafil (LEVITRA) 10 MG tablet Take 10 mg by mouth as needed. For erectile dysfunction   Yes Historical Provider, MD    Social History:    reports that he has been smoking Cigarettes.  He has a 2.5 pack-year smoking history. He does not have any smokeless tobacco history on  file. He reports that he uses illicit drugs (Marijuana) about 5 times per week. He reports that he does not drink alcohol.   Family History:    History reviewed. No pertinent family history.  Mental Status Examination/Evaluation: Objective:  Appearance: Casual and obese  Eye Contact::  Good  Speech:  Clear and Coherent, Normal Rate and Slow  Volume:  Normal  Mood:  Depressed but wants to leave  Affect:  Blunt  Thought Process:  Disorganized and cannot give reasons for the overdose  Orientation:  Other:  oriented to person, place  Thought Content:  guarded and continues to say 'don't know'  Suicidal Thoughts:  No  Homicidal Thoughts:  No  Judgement:  Impaired  Insight:  Lacking   DIAGNOSIS:   AXIS I   Overdose, compulsive?, Antifreeze without SI statement  AXIS II  Deferred  AXIS III See medical notes.  AXIS IV educational problems, other psychosocial or environmental problems, problems related to social environment and social problems with peers in group home  AXIS V 50-50 serious symptoms   Assessment/Plan:  Discussed with Dr. [name], Psych CSW Pt had a very coherent conversation with Psych CSW.  With MD, he seemed more guarded.  He continues to say he has no idea why he drank antifreeze.  Question remains whether he has been socially isolated, ostracized by peers to the degree he took the antifreeze, or? Other.  He has a girlfriend who he earlier thought he had lost but today rec'd a call from her.      He may benefit from [facility] group psycho-education [which he may have never had].  He may find alternatives to antifreeze - to help modulate moods while an inpatient.  RECOMMENDATION:  1.  Behavior of repeated drinking antifreeze is very high risk behavior.  Recommend transfer to [facility]     Or comparable facility  2.  No recommendation for medication until medically stable -home meds incl. CLOZARIL - need to know when last given and also need WBC.  3.  Continue with weekend  consultation.  Roberta Angell MD 01/27/2013 1:18 PM

## 2013-01-27 NOTE — Progress Notes (Signed)
Meet-and-greet with patient. Introduced patient to counseling services. Patient declined counseling services at this time.   Sherol Dade Counseling Intern Haroldine Laws

## 2013-01-28 LAB — CBC
HCT: 32.5 % — ABNORMAL LOW (ref 39.0–52.0)
Hemoglobin: 11 g/dL — ABNORMAL LOW (ref 13.0–17.0)
MCH: 30.9 pg (ref 26.0–34.0)
MCHC: 33.8 g/dL (ref 30.0–36.0)
RBC: 3.56 MIL/uL — ABNORMAL LOW (ref 4.22–5.81)

## 2013-01-28 LAB — BASIC METABOLIC PANEL
BUN: 26 mg/dL — ABNORMAL HIGH (ref 6–23)
CO2: 25 mEq/L (ref 19–32)
Chloride: 109 mEq/L (ref 96–112)
Glucose, Bld: 102 mg/dL — ABNORMAL HIGH (ref 70–99)
Potassium: 4.7 mEq/L (ref 3.5–5.1)
Sodium: 140 mEq/L (ref 135–145)

## 2013-01-28 LAB — GLUCOSE, CAPILLARY
Glucose-Capillary: 102 mg/dL — ABNORMAL HIGH (ref 70–99)
Glucose-Capillary: 130 mg/dL — ABNORMAL HIGH (ref 70–99)
Glucose-Capillary: 122 mg/dL — ABNORMAL HIGH (ref 70–99)

## 2013-01-28 NOTE — Progress Notes (Signed)
Subjective:   Making good urine but creatinine is still elevated although stable from yest, hopefully plateauing.  He is a little more animated today.    Objective Vital signs in last 24 hours: Filed Vitals:   01/27/13 2023 01/28/13 0444 01/28/13 0951 01/28/13 1227  BP: 135/75 111/61 122/70 115/64  Pulse: 74 82 85 77  Temp: 98.3 F (36.8 C) 98 F (36.7 C) 98 F (36.7 C) 98.4 F (36.9 C)  TempSrc: Oral Oral    Resp: 18 18 18 18   Height:      Weight: 96.117 kg (211 lb 14.4 oz)     SpO2: 100% 95% 99% 99%   Weight change: 1.718 kg (3 lb 12.6 oz)  Intake/Output Summary (Last 24 hours) at 01/28/13 1238 Last data filed at 01/28/13 0951  Gross per 24 hour  Intake   3795 ml  Output   3750 ml  Net     45 ml   Labs: Basic Metabolic Panel:  Lab 01/28/13 0454 01/27/13 0545 01/26/13 0610 01/25/13 0402 01/24/13 1301 01/24/13 0400  NA 140 138 138 -- -- --  K 4.7 4.1 4.0 -- -- --  CL 109 106 102 -- -- --  CO2 25 23 24  -- -- --  GLUCOSE 102* 114* 106* -- -- --  BUN 26* 25* 15 -- -- --  CREATININE 3.33* 3.30* 2.73* -- -- --  CALCIUM 8.6 9.0 8.7 -- -- --  ALB -- -- -- -- -- --  PHOS -- -- -- 4.8* 3.7 1.0*   Liver Function Tests:  Lab 01/25/13 0402 01/24/13 1345 01/23/13 1450  AST 16 21 19   ALT 9 12 13   ALKPHOS 90 108 108  BILITOT 0.4 0.4 0.2*  PROT 5.9* 6.9 7.3  ALBUMIN 3.1* 3.9 4.0   No results found for this basename: LIPASE:3,AMYLASE:3 in the last 168 hours No results found for this basename: AMMONIA:3 in the last 168 hours CBC:  Lab 01/28/13 0605 01/27/13 1845 01/27/13 0545 01/26/13 1726 01/26/13 0610 01/25/13 0402  WBC 6.6 7.0 7.4 -- -- --  NEUTROABS -- -- -- -- -- 7.5  HGB 11.0* 11.0* 11.4* -- -- --  HCT 32.5* 31.8* 33.4* -- -- --  MCV 91.3 91.1 90.3 91.3 91.3 --  PLT 219 181 175 -- -- --   Cardiac Enzymes: No results found for this basename: CKTOTAL:5,CKMB:5,CKMBINDEX:5,TROPONINI:5 in the last 168 hours CBG:  Lab 01/28/13 1125 01/28/13 0724 01/28/13 0419  01/28/13 0015 01/27/13 2022  GLUCAP 130* 102* 99 134* 120*    Iron Studies: No results found for this basename: IRON,TIBC,TRANSFERRIN,FERRITIN in the last 72 hours Studies/Results: No results found. Medications: Infusions:    . sodium chloride 100 mL/hr at 01/28/13 0522    Scheduled Medications:    . pantoprazole (PROTONIX) IV  40 mg Intravenous Q12H    have reviewed scheduled and prn medications.  Physical Exam: General: awake, eating lunch Heart: RRR Lungs: mostly clear Abdomen: soft, non tender Extremities: minimal edema Dialysis Access: r sided HD cath, placed 1/26  I Assessment/ Plan: Pt is a 52 y.o. yo male who was admitted on 01/23/2013 with confusion and metabolic acidosis/osmolar gap with history of previous episodes of Ethylene glycol ingestion  Assessment/Plan: 1.  Ethylene glycol toxicity-   Patient received a 6 hour dialysis session Monday and Tuesday for worsening osm as well as worsening renal function. His level is now undetectable.  Does not need any further HD for clearance of ethylene glycol but now has acute on chronic  renal failure although is nonoliguric and hopefully plateauing.   2. AKI-  Probably multifactorial due to ethylene glycol/urinary retention as well as transient hypotension which may have caused ATN. UOP good,  trying to avoid further episodes of hypotension and keep foley in for now. He may have done some permanent renal damage with this latest episode.   Will continue to support with IVF to "flush" the crystals and hope that this is reversible.  He cannot go to [facility] (probably where he needs to be ) with these issues ongoing.  Hopefully is plateauing.   3. Hypophosphatemia- resolved. 4. Hypokalemia  - resolved      Malita Ignasiak A   01/28/2013,12:38 PM  LOS: 5 days

## 2013-01-28 NOTE — Progress Notes (Signed)
Clinical Social Work  CSW spoke with assessments at [facility] who reports patient is not medically stable to be admitted. [facility] requests that CSW resubmit when patient medically stable and he will be reviewed at that time.  [place], [place] [phone]

## 2013-01-28 NOTE — Progress Notes (Signed)
Clinical Social Work Progress Note PSYCHIATRY SERVICE LINE 01/28/2013  Patient:  Todd Fitzgerald  Account:  [id number]  Admit Date:  01/23/2013  Clinical Social Worker:  Unk Lightning, LCSW  Date/Time:  01/28/2013 02:00 PM  Review of Patient  Overall Medical Condition:   Patient reports he still feels bad. Patient reports "I know I messed myself up."   Participation Level:  Active  Participation Quality  Guarded   Other Participation Quality:   Affect  Depressed   Cognitive  Alert   Reaction to Medications/Concerns:   None reported   Modes of Intervention  Support   Summary of Progress/Plan at Discharge   CSW met with patient at bedside. When CSW arrived, patient's aunt and friend were leaving. Patient agreeable to session.    CSW spoke with patient regarding how he was feeling today. Patient reports he still does not feel well and just "wants to get out of here." CSW asked patient to clarify where he wanted to go and patient reports "I want to leave this world."  CSW spoke with patient regarding any SI or HI. Patient was unable to give a specific example of a plan to commit suicide but has attempted suicide by drinking antifreeze several times in the past. Patient has reported in the past to CSW that he was not trying to harm himself by drinking the antifreeze but reports today that his goal was to harm himself. Patient unable to contract for safety at this time.    CSW spoke with patient regarding any triggers to depression and SI today. Patient reports he does not want visitors. CSW encouraged patient to set boundaries with his family regarding visiting. Patient states he spoke with girlfriend yesterday and it was a good conversation. Patient is unable to identify any further triggers to depression or SI. Patient struggles with developing other alternatives to managing his symptoms.    CSW staffed case with psych MD and made referral for inpatient psych hospital.

## 2013-01-28 NOTE — Progress Notes (Signed)
[facility] PROGRESS NOTE  Todd Fitzgerald WJX:[id number] DOB: 12-06-1961 DOA: 01/23/2013 PCP: No primary provider on file.  Assessment/Plan: 1. Ethylene glycol poisoning - levels undetectable now.  2. Suicidal ideation - psychiatry consulted, appreciate their plan regarding safety following his recurrent ingestions ad well as medications options.  3. Acute on chronic renal failure - 2/2 ethylene glycol ingestion. Nephrology following, appreciate their input. No longer in need for HD for ethylene glycol at this point. Monitoring renal function which has not yet peaked. He will likely establish a new baseline, hopefully not in need for IHD yet.   Code Status: Full code Family Communication: None (indicate person spoken with, relationship, and if by phone, the number) Disposition Plan: Unknown   Consultants:  Psychiatry   Nephrology  Procedures:  none  Antibiotics:  none   HPI/Subjective: - complains of inability to sleep.  Objective: Filed Vitals:   01/28/13 0444 01/28/13 0951 01/28/13 1227 01/28/13 1631  BP: 111/61 122/70 115/64 133/71  Pulse: 82 85 77 73  Temp: 98 F (36.7 C) 98 F (36.7 C) 98.4 F (36.9 C) 97.6 F (36.4 C)  TempSrc: Oral     Resp: 18 18 18 18   Height:      Weight:      SpO2: 95% 99% 99% 99%    Intake/Output Summary (Last 24 hours) at 01/28/13 1638 Last data filed at 01/28/13 1632  Gross per 24 hour  Intake   3675 ml  Output   5000 ml  Net  -1325 ml   Filed Weights   01/25/13 1305 01/26/13 2110 01/27/13 2023  Weight: 94.4 kg (208 lb 1.8 oz) 94.399 kg (208 lb 1.8 oz) 96.117 kg (211 lb 14.4 oz)    Exam:   General:  NAD  Cardiovascular: RRR, no MRG  Respiratory: CTA biL  Abdomen: mildly tender to palpation throughout, BS+  Data Reviewed: Basic Metabolic Panel:  Lab 01/28/13 2130 01/27/13 0545 01/26/13 0610 01/25/13 0402 01/24/13 2045 01/24/13 1301 01/24/13 0400  NA 140 138 138 141 140 -- --  K 4.7 4.1 4.0 3.2* 3.8 -- --  CL  109 106 102 102 98 -- --  CO2 25 23 24 29 31  -- --  GLUCOSE 102* 114* 106* 100* 105* -- --  BUN 26* 25* 15 15 11  -- --  CREATININE 3.33* 3.30* 2.73* 3.23* 2.68* -- --  CALCIUM 8.6 9.0 8.7 8.4 8.9 -- --  MG -- -- -- 2.4 -- -- 1.7  PHOS -- -- -- 4.8* -- 3.7 1.0*   Liver Function Tests:  Lab 01/25/13 0402 01/24/13 1345 01/23/13 1450  AST 16 21 19   ALT 9 12 13   ALKPHOS 90 108 108  BILITOT 0.4 0.4 0.2*  PROT 5.9* 6.9 7.3  ALBUMIN 3.1* 3.9 4.0   CBC:  Lab 01/28/13 0605 01/27/13 1845 01/27/13 0545 01/26/13 1726 01/26/13 0610 01/25/13 0402  WBC 6.6 7.0 7.4 7.4 7.6 --  NEUTROABS -- -- -- -- -- 7.5  HGB 11.0* 11.0* 11.4* 11.2* 11.7* --  HCT 32.5* 31.8* 33.4* 32.6* 34.8* --  MCV 91.3 91.1 90.3 91.3 91.3 --  PLT 219 181 175 171 173 --   CBG:  Lab 01/28/13 1125 01/28/13 0724 01/28/13 0419 01/28/13 0015 01/27/13 2022  GLUCAP 130* 102* 99 134* 120*    Recent Results (from the past 240 hour(s))  MRSA PCR SCREENING     Status: Normal   Collection Time   01/23/13  7:45 PM      Component Value  Range Status Comment   MRSA by PCR NEGATIVE  NEGATIVE Final   URINE CULTURE     Status: Normal   Collection Time   01/23/13  7:46 PM      Component Value Range Status Comment   Specimen Description URINE, CLEAN CATCH   Final    Special Requests NONE   Final    Culture  Setup Time 01/24/2013 01:22   Final    Colony Count NO GROWTH   Final    Culture NO GROWTH   Final    Report Status 01/25/2013 FINAL   Final      Studies: No results found.  Scheduled Meds:    . pantoprazole (PROTONIX) IV  40 mg Intravenous Q12H   Continuous Infusions:    . sodium chloride 100 mL/hr at 01/28/13 0522    Principal Problem:  *Antifreeze poisoning Active Problems:  Acute respiratory failure  Hypernatremia  AKI (acute kidney injury)  Lactic acidosis  Hematuria  Suicide attempt by drug ingestion  [name]  [facility] Pager [phone]. If 7PM-7AM, please contact night-coverage at  [email or web address], password [facility] 01/28/2013, 4:38 PM  LOS: 5 days

## 2013-01-29 LAB — BASIC METABOLIC PANEL
Chloride: 109 mEq/L (ref 96–112)
GFR calc Af Amer: 27 mL/min — ABNORMAL LOW (ref >90)
GFR calc non Af Amer: 23 mL/min — ABNORMAL LOW (ref >90)
Potassium: 4.6 mEq/L (ref 3.5–5.1)
Sodium: 140 mEq/L (ref 135–145)

## 2013-01-29 LAB — GLUCOSE, CAPILLARY
Glucose-Capillary: 110 mg/dL — ABNORMAL HIGH (ref 70–99)
Glucose-Capillary: 114 mg/dL — ABNORMAL HIGH (ref 70–99)

## 2013-01-29 MED ORDER — LORAZEPAM 1 MG PO TABS
1.0000 mg | ORAL_TABLET | Freq: Once | ORAL | Status: AC
  Administered 2013-01-29: 1 mg via ORAL
  Filled 2013-01-29: qty 1

## 2013-01-29 MED ORDER — TRAZODONE HCL 50 MG PO TABS
50.0000 mg | ORAL_TABLET | Freq: Every evening | ORAL | Status: DC | PRN
  Administered 2013-01-31: 50 mg via ORAL
  Filled 2013-01-29: qty 1

## 2013-01-29 NOTE — Consult Note (Signed)
Patient Identification:  Todd Fitzgerald Date of Evaluation:  01/29/2013 Reason for Consult:  Drank Antifreeze  Referring Provider:  Dr. [name]  History of Present Illness:Pt lives in group home and was given 4-wk leave.  He drank antifreeze and cannot say why.     Interval Hx: Seen today. Reports being depressed. Very poor historian. denies any SI but per sitter he was asking the other sitting to help with another SI attempt now. Willing to take meds for his depression. Reports poor sleep at night but sleep a lot during day times these days. Per pt he prefers to go home but not sure vs [facility].  Past Psychiatric History:  Other psychiatric admissions.  He has drank antifreeze at least 3 time and cannot say he was 'looking for alcohol', or 'wanting to die'.   He is in a group home but unable to say for how long. He says the members tease him.   Past Medical History:     Past Medical History  Diagnosis Date  . 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 CKD (chronic kidney disease)     questionable  . Heart murmur   . MDD (major depressive disorder)   . ED (erectile dysfunction)   . BPH (benign prostatic hyperplasia)   . PTSD (post-traumatic stress disorder)   . HLD (hyperlipidemia)   . Borderline personality disorder   . Schizoaffective disorder   . Ruptured disc, cervical       History reviewed. No pertinent past surgical history.  Allergies: No Known Allergies  Current Medications:  Prior to Admission medications   Medication Sig Start Date End Date Taking? Authorizing Provider  aspirin EC 81 MG tablet Take 81 mg by mouth at bedtime.   Yes Historical Provider, MD  atropine 1 % ophthalmic solution Place 2 drops under the tongue at bedtime.   Yes Historical Provider, MD  benztropine (COGENTIN) 1 MG tablet Take 3 mg by mouth 2 (two) times daily.   Yes Historical Provider, MD  cholecalciferol (VITAMIN D) 400 UNITS TABS Take 800 Units  by mouth at bedtime.    Yes Historical Provider, MD  cloZAPine (CLOZARIL) 100 MG tablet Take 200-300 mg by mouth 2 (two) times daily. 200mg  in the morning; 300mg  in the evening   Yes Historical Provider, MD  fish oil-omega-3 fatty acids 1000 MG capsule Take 1 g by mouth at bedtime.    Yes Historical Provider, MD  gemfibrozil (LOPID) 600 MG tablet Take 600 mg by mouth 2 (two) times daily.   Yes Historical Provider, MD  haloperidol (HALDOL) 2 MG tablet Take 2 mg by mouth every morning.   Yes Historical Provider, MD  haloperidol (HALDOL) 5 MG tablet Take 5 mg by mouth daily. 5 PM   Yes Historical Provider, MD  hydrOXYzine (ATARAX/VISTARIL) 50 MG tablet Take 100 mg by mouth 2 (two) times daily as needed. For anxiety   Yes Historical Provider, MD  isoniazid (NYDRAZID) 100 MG tablet Take 300 mg by mouth daily. on an empty stomach daily   Yes Historical Provider, MD  lamoTRIgine (LAMICTAL) 100 MG tablet Take 250 mg by mouth daily.    Yes Historical Provider, MD  LORazepam (ATIVAN) 0.5 MG tablet Take 0.5 mg by mouth 2 (two) times daily.    Yes Historical Provider, MD  polyethylene glycol (MIRALAX / GLYCOLAX) packet Take 17 g by mouth daily.   Yes Historical Provider, MD  pyridOXINE (  VITAMIN B-6) 100 MG tablet Take 50 mg by mouth daily.   Yes Historical Provider, MD  ranitidine (ZANTAC) 150 MG tablet Take 150 mg by mouth 2 (two) times daily.   Yes Historical Provider, MD  selegiline (EMSAM) 12 MG/24HR Place 1 patch onto the skin daily.   Yes Historical Provider, MD  senna (SENOKOT) 8.6 MG tablet Take 2 tablets by mouth 2 (two) times daily.    Yes Historical Provider, MD  simvastatin (ZOCOR) 20 MG tablet Take 20 mg by mouth daily.   Yes Historical Provider, MD  Tamsulosin HCl (FLOMAX) 0.4 MG CAPS Take 0.4 mg by mouth daily after breakfast.   Yes Historical Provider, MD  topiramate (TOPAMAX) 50 MG tablet Take 50 mg by mouth at bedtime.   Yes Historical Provider, MD  traZODone (DESYREL) 100 MG tablet Take 200  mg by mouth at bedtime.    Yes Historical Provider, MD  vardenafil (LEVITRA) 10 MG tablet Take 10 mg by mouth as needed. For erectile dysfunction   Yes Historical Provider, MD    Social History:    reports that he has been smoking Cigarettes.  He has a 2.5 pack-year smoking history. He does not have any smokeless tobacco history on file. He reports that he uses illicit drugs (Marijuana) about 5 times per week. He reports that he does not drink alcohol.   Family History:    History reviewed. No pertinent family history.  Mental Status Examination/Evaluation: Objective:  Appearance: Casual and obese  Eye Contact::  Good  Speech:  Clear and Coherent, Normal Rate and Slow  Volume:  Normal  Mood:  Depressed   Affect:  Blunt  Thought Process:  doorganized  Orientation:  Other:  oriented to person, place  Thought Content:  guarded and continues to say 'don't know'  Suicidal Thoughts:  No  Homicidal Thoughts:  No  Judgement:  Impaired  Insight:  Lacking   DIAGNOSIS:   AXIS I  r/o dep d/o nos, r/o psychotic d/o nos Overdose, compulsive?, Antifreeze without SI statement  AXIS II  Deferred  AXIS III See medical notes.  AXIS IV educational problems, other psychosocial or environmental problems, problems related to social environment and social problems with peers in group home  AXIS V 41-50 serious symptoms   .  RECOMMENDATION:   1. Continue to  Recommend transfer to [facility]   Or comparable facility  2. Will recommend to consider Celexa 10 mg QAM for depression as pt repeated talks about being depressed 3.  Will continue to follow as needed 4. TSH if not done earlier 5. Will encourage to not sleep during day    01/29/2013 5:59 PM      Review of Systems  Neurological: Positive for tremors.     Physical Exam

## 2013-01-29 NOTE — Progress Notes (Signed)
Subjective:   No complaints, up in the chair. Keeps saying "I'm dead". No sob or n/v/d   Objective Vital signs in last 24 hours: Filed Vitals:   01/28/13 1631 01/28/13 2118 01/29/13 0457 01/29/13 1026  BP: 133/71 154/75 137/74 149/74  Pulse: 73 75 74 84  Temp: 97.6 F (36.4 C) 98 F (36.7 C) 98 F (36.7 C) 97.9 F (36.6 C)  TempSrc:  Oral Oral Oral  Resp: 18 17 18 18   Height:      Weight:  95.754 kg (211 lb 1.6 oz)    SpO2: 99% 98% 97% 99%   Weight change: -0.363 kg (-12.8 oz)  Intake/Output Summary (Last 24 hours) at 01/29/13 1304 Last data filed at 01/29/13 1027  Gross per 24 hour  Intake   4320 ml  Output   4800 ml  Net   -480 ml   Labs: Basic Metabolic Panel:  Lab 01/29/13 0981 01/28/13 0605 01/27/13 0545 01/25/13 0402 01/24/13 1301 01/24/13 0400  NA 140 140 138 -- -- --  K 4.6 4.7 4.1 -- -- --  CL 109 109 106 -- -- --  CO2 22 25 23  -- -- --  GLUCOSE 169* 102* 114* -- -- --  BUN 27* 26* 25* -- -- --  CREATININE 3.00* 3.33* 3.30* -- -- --  CALCIUM 8.8 8.6 9.0 -- -- --  ALB -- -- -- -- -- --  PHOS -- -- -- 4.8* 3.7 1.0*   Liver Function Tests:  Lab 01/25/13 0402 01/24/13 1345 01/23/13 1450  AST 16 21 19   ALT 9 12 13   ALKPHOS 90 108 108  BILITOT 0.4 0.4 0.2*  PROT 5.9* 6.9 7.3  ALBUMIN 3.1* 3.9 4.0   No results found for this basename: LIPASE:3,AMYLASE:3 in the last 168 hours No results found for this basename: AMMONIA:3 in the last 168 hours CBC:  Lab 01/28/13 0605 01/27/13 1845 01/27/13 0545 01/26/13 1726 01/26/13 0610 01/25/13 0402  WBC 6.6 7.0 7.4 -- -- --  NEUTROABS -- -- -- -- -- 7.5  HGB 11.0* 11.0* 11.4* -- -- --  HCT 32.5* 31.8* 33.4* -- -- --  MCV 91.3 91.1 90.3 91.3 91.3 --  PLT 219 181 175 -- -- --   Cardiac Enzymes: No results found for this basename: CKTOTAL:5,CKMB:5,CKMBINDEX:5,TROPONINI:5 in the last 168 hours CBG:  Lab 01/29/13 1159 01/29/13 0811 01/29/13 0427 01/29/13 0019 01/28/13 2004  GLUCAP 99 103* 110* 114* 122*    Iron  Studies: No results found for this basename: IRON,TIBC,TRANSFERRIN,FERRITIN in the last 72 hours Studies/Results: No results found. Medications: Infusions:    . sodium chloride 100 mL/hr at 01/29/13 1052    Scheduled Medications:    . pantoprazole (PROTONIX) IV  40 mg Intravenous Q12H    have reviewed scheduled and prn medications.  Physical Exam: General: awake, eating lunch Heart: RRR Lungs: mostly clear Abdomen: soft, non tender Extremities: minimal edema Dialysis Access: r sided HD cath, placed 1/26  I Assessment/ Plan: Pt is a 52 y.o. yo male who was admitted on 01/23/2013 with confusion and metabolic acidosis/osmolar gap with history of previous episodes of Ethylene glycol ingestion   Assessment/Plan: 1.  Ethylene glycol toxicity / AKI-   Patient had HD x 2 on 1/27 and 1/28 for ethylene glycol overdose. His level is now undetectable.  Does not need any further HD for clearance of ethylene glycol.  Has AKI due to glycolate-induced renal injury which may or may not recover over time.  May take a long time  to recover as well and does not necessarily need to stay here until renal function is fully recovered. Will sign off, please call as needed  2. Hypophosphatemia- resolved. 3. Hypokalemia  - resolved    Todd Moselle  MD [facility] [phone] pgr    [phone] cell 01/29/2013, 1:19 PM

## 2013-01-29 NOTE — Progress Notes (Signed)
Patient restless and unable to sleep. " I am dead." Patient feels hopeless.  Comforted patient. Sitter at bedside. Will call MD.

## 2013-01-29 NOTE — Progress Notes (Signed)
[facility] PROGRESS NOTE  Todd Fitzgerald UJW:[id number] DOB: 08/31/1961 DOA: 01/23/2013 PCP: No primary provider on file.  Assessment/Plan: 1. Ethylene glycol poisoning - acute phase resolved. 2. Suicidal ideation - psychiatry consulted, appreciate their plan regarding safety following his recurrent ingestions ad well as medications options.  3. Acute on chronic renal failure - 2/2 ethylene glycol ingestion. His creatinine is 3.00 today, improved from yesterday. This is reassuring that his renal function peaked. We will continue to monitor and likely he will establish a new baseline.  4. Inability to sleep - will try low dose trazodone tonight.    Code Status: Full code Family Communication: None (indicate person spoken with, relationship, and if by phone, the number) Disposition Plan: Unknown   Consultants:  Psychiatry   Nephrology  Procedures:  none  Antibiotics:  none   HPI/Subjective: - complains of inability to sleep. His abdomen is better today however he endorses feeling bloated. States "I am dead" but cannot elaborate further.   Objective: Filed Vitals:   01/28/13 1631 01/28/13 2118 01/29/13 0457 01/29/13 1026  BP: 133/71 154/75 137/74 149/74  Pulse: 73 75 74 84  Temp: 97.6 F (36.4 C) 98 F (36.7 C) 98 F (36.7 C) 97.9 F (36.6 C)  TempSrc:  Oral Oral Oral  Resp: 18 17 18 18   Height:      Weight:  95.754 kg (211 lb 1.6 oz)    SpO2: 99% 98% 97% 99%    Intake/Output Summary (Last 24 hours) at 01/29/13 1228 Last data filed at 01/29/13 1027  Gross per 24 hour  Intake   4320 ml  Output   4800 ml  Net   -480 ml   Filed Weights   01/26/13 2110 01/27/13 2023 01/28/13 2118  Weight: 94.399 kg (208 lb 1.8 oz) 96.117 kg (211 lb 14.4 oz) 95.754 kg (211 lb 1.6 oz)    Exam:   General:  NAD  Cardiovascular: RRR, no MRG  Respiratory: CTA biL  Abdomen: non tender to palpation, BS+, soft, no rebound  Data Reviewed: Basic Metabolic Panel:  Lab  01/29/13 0920 01/28/13 0605 01/27/13 0545 01/26/13 0610 01/25/13 0402 01/24/13 1301 01/24/13 0400  NA 140 140 138 138 141 -- --  K 4.6 4.7 4.1 4.0 3.2* -- --  CL 109 109 106 102 102 -- --  CO2 22 25 23 24 29  -- --  GLUCOSE 169* 102* 114* 106* 100* -- --  BUN 27* 26* 25* 15 15 -- --  CREATININE 3.00* 3.33* 3.30* 2.73* 3.23* -- --  CALCIUM 8.8 8.6 9.0 8.7 8.4 -- --  MG -- -- -- -- 2.4 -- 1.7  PHOS -- -- -- -- 4.8* 3.7 1.0*   Liver Function Tests:  Lab 01/25/13 0402 01/24/13 1345 01/23/13 1450  AST 16 21 19   ALT 9 12 13   ALKPHOS 90 108 108  BILITOT 0.4 0.4 0.2*  PROT 5.9* 6.9 7.3  ALBUMIN 3.1* 3.9 4.0   CBC:  Lab 01/28/13 0605 01/27/13 1845 01/27/13 0545 01/26/13 1726 01/26/13 0610 01/25/13 0402  WBC 6.6 7.0 7.4 7.4 7.6 --  NEUTROABS -- -- -- -- -- 7.5  HGB 11.0* 11.0* 11.4* 11.2* 11.7* --  HCT 32.5* 31.8* 33.4* 32.6* 34.8* --  MCV 91.3 91.1 90.3 91.3 91.3 --  PLT 219 181 175 171 173 --   CBG:  Lab 01/29/13 1159 01/29/13 0811 01/29/13 0427 01/29/13 0019 01/28/13 2004  GLUCAP 99 103* 110* 114* 122*    Recent Results (from the past  240 hour(s))  MRSA PCR SCREENING     Status: Normal   Collection Time   01/23/13  7:45 PM      Component Value Range Status Comment   MRSA by PCR NEGATIVE  NEGATIVE Final   URINE CULTURE     Status: Normal   Collection Time   01/23/13  7:46 PM      Component Value Range Status Comment   Specimen Description URINE, CLEAN CATCH   Final    Special Requests NONE   Final    Culture  Setup Time 01/24/2013 01:22   Final    Colony Count NO GROWTH   Final    Culture NO GROWTH   Final    Report Status 01/25/2013 FINAL   Final      Studies: No results found.  Scheduled Meds:    . pantoprazole (PROTONIX) IV  40 mg Intravenous Q12H   Continuous Infusions:    . sodium chloride 100 mL/hr at 01/29/13 1052    Principal Problem:  *Antifreeze poisoning Active Problems:  Acute respiratory failure  Hypernatremia  AKI (acute kidney injury)   Lactic acidosis  Hematuria  Suicide attempt by drug ingestion  Todd Fitzgerald  [facility] Pager [phone]. If 7PM-7AM, please contact night-coverage at [email or web address], password [facility] 01/29/2013, 12:28 PM  LOS: 6 days

## 2013-01-30 LAB — GLUCOSE, CAPILLARY
Glucose-Capillary: 97 mg/dL (ref 70–99)
Glucose-Capillary: 99 mg/dL (ref 70–99)

## 2013-01-30 LAB — BASIC METABOLIC PANEL
BUN: 25 mg/dL — ABNORMAL HIGH (ref 6–23)
CO2: 23 mEq/L (ref 19–32)
Chloride: 105 mEq/L (ref 96–112)
Glucose, Bld: 95 mg/dL (ref 70–99)
Potassium: 4.4 mEq/L (ref 3.5–5.1)

## 2013-01-30 MED ORDER — LORAZEPAM 1 MG PO TABS
1.0000 mg | ORAL_TABLET | Freq: Four times a day (QID) | ORAL | Status: DC | PRN
  Administered 2013-01-30 (×2): 1 mg via ORAL
  Filled 2013-01-30 (×2): qty 1

## 2013-01-30 MED ORDER — LORAZEPAM 2 MG/ML IJ SOLN
1.0000 mg | Freq: Once | INTRAMUSCULAR | Status: AC
  Administered 2013-01-30: 1 mg via INTRAVENOUS
  Filled 2013-01-30 (×2): qty 1

## 2013-01-30 MED ORDER — SODIUM CHLORIDE 0.9 % IV SOLN
INTRAVENOUS | Status: DC
  Administered 2013-01-30 – 2013-01-31 (×2): via INTRAVENOUS

## 2013-01-30 NOTE — Progress Notes (Signed)
Pt has agitated and restless throughout the night. Repeats "I'm dead, I'm dead, throw me out the window." Pt states, "Please help me die." Has hit hand on railing once. MD made aware. Awaiting for further orders. Will continue to monitor with safety sitter at bedside.

## 2013-01-30 NOTE — Consult Note (Signed)
Patient Identification:  Todd Fitzgerald Date of Evaluation:  01/30/2013 Reason for Consult:  Drank Antifreeze  Referring Provider:  Dr. [name]  History of Present Illness:Pt lives in group home and was given 4-wk leave.  He drank antifreeze and cannot say why.     Interval Hx:  Seen today. Continues to be depressed.  denies any SI today. Family visited him today but pt could not remember until nursing reminded him. Willing to take meds for his depression. Reports poor sleep but pt it is getting better. Poor historian.  Past Psychiatric History:  Other psychiatric admissions.  He has drank antifreeze at least 3 time and cannot say he was 'looking for alcohol', or 'wanting to die'.   He is in a group home but unable to say for how long. He says the members tease him.   Past Medical History:     Past Medical History  Diagnosis Date  . 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 CKD (chronic kidney disease)     questionable  . Heart murmur   . MDD (major depressive disorder)   . ED (erectile dysfunction)   . BPH (benign prostatic hyperplasia)   . PTSD (post-traumatic stress disorder)   . HLD (hyperlipidemia)   . Borderline personality disorder   . Schizoaffective disorder   . Ruptured disc, cervical       History reviewed. No pertinent past surgical history.  Allergies: No Known Allergies  Current Medications:  Prior to Admission medications   Medication Sig Start Date End Date Taking? Authorizing Provider  aspirin EC 81 MG tablet Take 81 mg by mouth at bedtime.   Yes Historical Provider, MD  atropine 1 % ophthalmic solution Place 2 drops under the tongue at bedtime.   Yes Historical Provider, MD  benztropine (COGENTIN) 1 MG tablet Take 3 mg by mouth 2 (two) times daily.   Yes Historical Provider, MD  cholecalciferol (VITAMIN D) 400 UNITS TABS Take 800 Units by mouth at bedtime.    Yes Historical Provider, MD  cloZAPine (CLOZARIL)  100 MG tablet Take 200-300 mg by mouth 2 (two) times daily. 200mg  in the morning; 300mg  in the evening   Yes Historical Provider, MD  fish oil-omega-3 fatty acids 1000 MG capsule Take 1 g by mouth at bedtime.    Yes Historical Provider, MD  gemfibrozil (LOPID) 600 MG tablet Take 600 mg by mouth 2 (two) times daily.   Yes Historical Provider, MD  haloperidol (HALDOL) 2 MG tablet Take 2 mg by mouth every morning.   Yes Historical Provider, MD  haloperidol (HALDOL) 5 MG tablet Take 5 mg by mouth daily. 5 PM   Yes Historical Provider, MD  hydrOXYzine (ATARAX/VISTARIL) 50 MG tablet Take 100 mg by mouth 2 (two) times daily as needed. For anxiety   Yes Historical Provider, MD  isoniazid (NYDRAZID) 100 MG tablet Take 300 mg by mouth daily. on an empty stomach daily   Yes Historical Provider, MD  lamoTRIgine (LAMICTAL) 100 MG tablet Take 250 mg by mouth daily.    Yes Historical Provider, MD  LORazepam (ATIVAN) 0.5 MG tablet Take 0.5 mg by mouth 2 (two) times daily.    Yes Historical Provider, MD  polyethylene glycol (MIRALAX / GLYCOLAX) packet Take 17 g by mouth daily.   Yes Historical Provider, MD  pyridOXINE (VITAMIN B-6) 100 MG tablet Take 50 mg by mouth daily.   Yes Historical Provider, MD  ranitidine (ZANTAC) 150 MG tablet Take 150 mg by mouth 2 (two) times daily.   Yes Historical Provider, MD  selegiline (EMSAM) 12 MG/24HR Place 1 patch onto the skin daily.   Yes Historical Provider, MD  senna (SENOKOT) 8.6 MG tablet Take 2 tablets by mouth 2 (two) times daily.    Yes Historical Provider, MD  simvastatin (ZOCOR) 20 MG tablet Take 20 mg by mouth daily.   Yes Historical Provider, MD  Tamsulosin HCl (FLOMAX) 0.4 MG CAPS Take 0.4 mg by mouth daily after breakfast.   Yes Historical Provider, MD  topiramate (TOPAMAX) 50 MG tablet Take 50 mg by mouth at bedtime.   Yes Historical Provider, MD  traZODone (DESYREL) 100 MG tablet Take 200 mg by mouth at bedtime.    Yes Historical Provider, MD  vardenafil  (LEVITRA) 10 MG tablet Take 10 mg by mouth as needed. For erectile dysfunction   Yes Historical Provider, MD    Social History:    reports that he has been smoking Cigarettes.  He has a 2.5 pack-year smoking history. He does not have any smokeless tobacco history on file. He reports that he uses illicit drugs (Marijuana) about 5 times per week. He reports that he does not drink alcohol.   Family History:    History reviewed. No pertinent family history.  Mental Status Examination/Evaluation: Objective:  Appearance: Casual and obese  Eye Contact::  Good  Speech:  Clear and Coherent, Normal Rate and Slow  Volume:  Normal  Mood:  Depressed   Affect:  Blunt  Thought Process:  doorganized  Orientation:  Other:  oriented to person, place  Thought Content:  guarded and continues to say 'don't know'  Suicidal Thoughts:  No  Homicidal Thoughts:  No  Judgement:  Impaired  Insight:  Lacking   DIAGNOSIS:   AXIS I  r/o dep d/o nos, r/o psychotic d/o nos Overdose, compulsive?, Antifreeze without SI statement  AXIS II  Deferred  AXIS III See medical notes.  AXIS IV educational problems, other psychosocial or environmental problems, problems related to social environment and social problems with peers in group home  AXIS V 41-50 serious symptoms   .  RECOMMENDATION:   1. Continue to  Recommend transfer to [facility]   Or comparable facility  2. Will recommend to consider Celexa 10 mg QAM for depression as pt repeated talks about being depressed 3.  Will continue to follow as needed 4. TSH if not done earlier 5. Will encourage to not sleep during day to sleep bettter at night.    01/30/2013 8:31 PM      Review of Systems  Neurological: Positive for tremors.     Physical Exam

## 2013-01-30 NOTE — Discharge Summary (Addendum)
Physician Discharge Summary  Todd Fitzgerald ZOX:[id number] DOB: [birth year] DOA: 01/23/2013  PCP: No primary provider on file.  Admit date: 01/23/2013 Discharge date: 02/01/2013  Time spent: 35 minutes  Discharge Diagnoses:  Principal Problem:  *Antifreeze poisoning Active Problems:  Acute respiratory failure  Hypernatremia  AKI (acute kidney injury)  Lactic acidosis  Hematuria  Suicide attempt by drug ingestion  Discharge Condition: stable  Diet recommendation: regular  Filed Weights   01/29/13 1954 01/30/13 1959 01/31/13 1951  Weight: 95.754 kg (211 lb 1.6 oz) 95.754 kg (211 lb 1.6 oz) 95.751 kg (211 lb 1.5 oz)   History of present illness:  52 y.o male brought to the [facility] ED earlier 1/26. His sister was called at 18 am by his girlfriend who stated the patient was acting delerious and funny and called 911. He was at his girlfriends home on a home visit from his group home today and started acting strange with thoughts he possibly ingested antifreeze. He has had toxic ingestions of antifreeze in the past multiple times. He has been hospitalized at [facility] x 3 years and [facility] hospital in the past. Patient lives in a [facility] group home in [place] since 05/03/2012. Spoke with Shonta at the home who provided history as well as sister [name].  Lab evaluation showed ABG 7.28 / 20 / 127, serum HCO3 10, chloride 102, Na 138, anion gap= 26. Past was given IV NaHCO3 3 amps and then IV bicarb gtt and transferred to [facility] for evaluation for possible diaysis. Also got fomepizole first dose around 2 pm on the day of admission. He was intubated after arrival here; he was communicative prior to intubation with mumbling speech per resident MD. UA showed 100 prot, mod LE, large Hgb, 3-6 wbc, tntc rbc's. Creat was 1.3  Hospital Course:  1. Ethylene glycol poisoning - Patient underwent urgent central line placement and hemodialysis on 27th and 28th. His ethylene glycol levels decreased following  each HD sessions and his fomepizole was stopped. Patient received supportive treatment and his renal function fortunately recovered to some extent and needed no further dialysis while here. His Foley and IV fluids were discontinued on 2/2 and his renal function continued to improve. 2. Suicidal ideation - psychiatry consulted while patient in the hospital. He continued to express suicidal ideation during his hospital stay. I would like to defer to psychiatry service his home medications, therefore I will continue at this point his medications for his underlying medical conditions (as below).  3. Acute on chronic renal failure - 2/2 ethylene glycol ingestion. His creatinine continued to improve. His Foley was discontinued on 2/2 and his IVF were KVO. Creatinine on d/c was 2.5  Procedures:  hemodialysis 01/24/13, 01/25/13.  Consultations:  Nephrology  Psychiatry  Discharge Exam: Filed Vitals:   01/31/13 1110 01/31/13 1759 01/31/13 1951 02/01/13 0413  BP: 143/80 151/80 131/78 129/65  Pulse: 90 88 77 72  Temp: 99 F (37.2 C) 97.3 F (36.3 C) 98.1 F (36.7 C) 98 F (36.7 C)  TempSrc: Oral Oral Oral Oral  Resp: 18 18 18 18   Height:   5\' 8"  (1.727 m)   Weight:   95.751 kg (211 lb 1.5 oz)   SpO2: 96% 96% 100% 95%   General: NAD Cardiovascular: RRR Respiratory: CTA biL    Medication List     As of 02/01/2013  9:30 AM    STOP taking these medications         benztropine 1 MG tablet  Commonly known as: COGENTIN      cloZAPine 100 MG tablet   Commonly known as: CLOZARIL      haloperidol 5 MG tablet   Commonly known as: HALDOL      hydrOXYzine 50 MG tablet   Commonly known as: ATARAX/VISTARIL      isoniazid 100 MG tablet   Commonly known as: NYDRAZID      lamoTRIgine 100 MG tablet   Commonly known as: LAMICTAL      ranitidine 150 MG tablet   Commonly known as: ZANTAC      selegiline 12 MG/24HR   Commonly known as: EMSAM      topiramate 50 MG tablet   Commonly known  as: TOPAMAX      vardenafil 10 MG tablet   Commonly known as: LEVITRA      TAKE these medications         aspirin EC 81 MG tablet   Take 81 mg by mouth at bedtime.      atropine 1 % ophthalmic solution   Place 2 drops under the tongue at bedtime.      cholecalciferol 400 UNITS Tabs   Commonly known as: VITAMIN D   Take 800 Units by mouth at bedtime.      fish oil-omega-3 fatty acids 1000 MG capsule   Take 1 g by mouth at bedtime.      gemfibrozil 600 MG tablet   Commonly known as: LOPID   Take 600 mg by mouth 2 (two) times daily.      LORazepam 0.5 MG tablet   Commonly known as: ATIVAN   Take 0.5 mg by mouth 2 (two) times daily.      pantoprazole 20 MG tablet   Commonly known as: PROTONIX   Take 1 tablet (20 mg total) by mouth 2 (two) times daily.      polyethylene glycol packet   Commonly known as: MIRALAX / GLYCOLAX   Take 17 g by mouth daily.      pyridOXINE 100 MG tablet   Commonly known as: VITAMIN B-6   Take 50 mg by mouth daily.      senna 8.6 MG tablet   Commonly known as: SENOKOT   Take 2 tablets by mouth 2 (two) times daily.      simvastatin 20 MG tablet   Commonly known as: ZOCOR   Take 20 mg by mouth daily.      Tamsulosin HCl 0.4 MG Caps   Commonly known as: FLOMAX   Take 0.4 mg by mouth daily after breakfast.      traZODone 100 MG tablet   Commonly known as: DESYREL   Take 200 mg by mouth at bedtime.         The results of significant diagnostics from this hospitalization (including imaging, microbiology, ancillary and laboratory) are listed below for reference.    Significant Diagnostic Studies: Dg Chest Port 1 View  01/24/2013  *RADIOLOGY REPORT*  Clinical Data: Ventilator patient.  Intubation.  PORTABLE CHEST - 1 VIEW  Comparison: 01/23/2013.  Findings: 0830 hours.  Endotracheal tube tip is in the mid trachea. There is a right IJ central venous catheter with its tip overlying the upper SVC.  A nasogastric tube projects below the  diaphragm. There are slightly lower lung volumes with increased probable bibasilar atelectasis.  No pneumothorax or significant pleural effusion is seen.  The heart size and mediastinal contours are stable allowing for positional differences.  IMPRESSION:  1.  Support system positioned  as above. 2.  Mildly increased bibasilar atelectasis.   Original Report Authenticated By: Carey Bullocks, M.D.    Dg Chest Port 1 View  01/23/2013  *RADIOLOGY REPORT*  Clinical Data: Status post intubation and dialysis catheter placement.  PORTABLE CHEST - 1 VIEW  Comparison: None.  Findings: ET tube is in good position with the tip at the level of the clavicular heads.  Tip of right IJ approach dialysis catheter projects over the upper superior vena cava.  Lungs are clear.  No pneumothorax.  Heart size normal.  IMPRESSION:  1.  ET tube in good position. 2.  Tip of right IJ approach dialysis catheter projects over the upper superior vena cava.  No pneumothorax. 3.  No acute finding.   Original Report Authenticated By: Holley Dexter, M.D.    Dg Abd 2 Views  01/25/2013  *RADIOLOGY REPORT*  Clinical Data: Abdominal pain and distension.  ABDOMEN - 2 VIEW  Comparison: 10/22/2008  Findings: There is a moderate to large stool burden.  Flecks of high attenuation may reflect ingested debris within the colon. Relative paucity of small bowel gas.  No free intraperitoneal air. No acute osseous finding.  IMPRESSION: Moderate to large stool burden.  Relative paucity of small bowel gas may reflect decompressed or fluid filled loops.   Original Report Authenticated By: Jearld Lesch, M.D.     Microbiology: Recent Results (from the past 240 hour(s))  MRSA PCR SCREENING     Status: Normal   Collection Time   01/23/13  7:45 PM      Component Value Range Status Comment   MRSA by PCR NEGATIVE  NEGATIVE Final   URINE CULTURE     Status: Normal   Collection Time   01/23/13  7:46 PM      Component Value Range Status Comment   Specimen  Description URINE, CLEAN CATCH   Final    Special Requests NONE   Final    Culture  Setup Time 01/24/2013 01:22   Final    Colony Count NO GROWTH   Final    Culture NO GROWTH   Final    Report Status 01/25/2013 FINAL   Final    Labs: Basic Metabolic Panel:  Lab 02/01/13 1610 01/31/13 0515 01/30/13 0540 01/29/13 0920 01/28/13 0605  NA 138 138 139 140 140  K 4.3 4.6 4.4 4.6 4.7  CL 104 105 105 109 109  CO2 24 24 23 22 25   GLUCOSE 101* 100* 95 169* 102*  BUN 25* 26* 25* 27* 26*  CREATININE 2.09* 2.52* 2.75* 3.00* 3.33*  CALCIUM 9.1 9.1 8.9 8.8 8.6  MG -- -- -- -- --  PHOS -- -- -- -- --   Liver Function Tests: No results found for this basename: AST:5,ALT:5,ALKPHOS:5,BILITOT:5,PROT:5,ALBUMIN:5 in the last 168 hours CBC:  Lab 01/28/13 0605 01/27/13 1845 01/27/13 0545 01/26/13 1726 01/26/13 0610  WBC 6.6 7.0 7.4 7.4 7.6  NEUTROABS -- -- -- -- --  HGB 11.0* 11.0* 11.4* 11.2* 11.7*  HCT 32.5* 31.8* 33.4* 32.6* 34.8*  MCV 91.3 91.1 90.3 91.3 91.3  PLT 219 181 175 171 173   CBG:  Lab 02/01/13 0737 02/01/13 0347 01/31/13 2346 01/31/13 1958 01/31/13 1635  GLUCAP 113* 110* 104* 125* 125*   Signed:  Almedia Cordell  [facility] 02/01/2013, 9:30 AM  Addendum: Patient has among his medication list Isoniazid as well as Vitamin B6. It is unclear when these medications were prescribed. Patient, prior to this hospitalization has been in a group  home for the past year and not on Isoniazid. Chart review shows Vitamin B-6 being on his medications as far as 2004 and since this medication is usually given with INH, I am assuming that he used to be on INH and B6 in the past. I recommend once his acute episode resolved that this matter be further looked into by his PCP. Patient needs to cooperate to some extent to explain when he did get this treatment and for what reason, and records be obtained. Will get quantiferon gold before transfer.

## 2013-01-30 NOTE — Progress Notes (Signed)
[facility] PROGRESS NOTE  Todd Fitzgerald ZOX:[id number] DOB: 04/04/1961 DOA: 01/23/2013 PCP: No primary provider on file.  Assessment/Plan: 1. Ethylene glycol poisoning - acute phase resolved. 2. Suicidal ideation - psychiatry consulted, appreciate their plan regarding safety following his recurrent ingestions ad well as medications options. Continues to express SI while hospitalized.  3. Acute on chronic renal failure - 2/2 ethylene glycol ingestion. His creatinine continues to improve. Will discontinue Foley today to let patient void and since he has adequate po intake will stop IVF. We'll continue to monitor his renal function, I think at this point he would be okay to leave her psychiatry disposition.  4. Inability to sleep - trazodone last night seemed to help, we'll continue that.  Code Status: Full code Family Communication: None Disposition Plan: Unknown   Consultants:  Psychiatry   Nephrology - signed off  Procedures:  none  Antibiotics:  none   HPI/Subjective: - States "I'm dead" and cannot elaborate further.   Objective: Filed Vitals:   01/29/13 1841 01/29/13 1954 01/30/13 0401 01/30/13 0943  BP: 129/79 126/58 143/90 132/63  Pulse: 78 73 81 85  Temp: 98.4 F (36.9 C) 97.9 F (36.6 C) 97.4 F (36.3 C) 98.1 F (36.7 C)  TempSrc: Oral Oral  Oral  Resp: 20 20 20 20   Height:  5\' 8"  (1.727 m)    Weight:  95.754 kg (211 lb 1.6 oz)    SpO2: 92% 96% 99% 97%    Intake/Output Summary (Last 24 hours) at 01/30/13 1010 Last data filed at 01/30/13 0700  Gross per 24 hour  Intake 3241.67 ml  Output   4100 ml  Net -858.33 ml   Filed Weights   01/27/13 2023 01/28/13 2118 01/29/13 1954  Weight: 96.117 kg (211 lb 14.4 oz) 95.754 kg (211 lb 1.6 oz) 95.754 kg (211 lb 1.6 oz)    Exam:   General:  NAD  Cardiovascular: RRR, no MRG  Respiratory: CTA biL  Abdomen: non tender to palpation, BS+, soft, no rebound  Data Reviewed: Basic Metabolic Panel:  Lab  01/30/13 0540 01/29/13 0920 01/28/13 0605 01/27/13 0545 01/26/13 0610 01/25/13 0402 01/24/13 1301 01/24/13 0400  NA 139 140 140 138 138 -- -- --  K 4.4 4.6 4.7 4.1 4.0 -- -- --  CL 105 109 109 106 102 -- -- --  CO2 23 22 25 23 24  -- -- --  GLUCOSE 95 169* 102* 114* 106* -- -- --  BUN 25* 27* 26* 25* 15 -- -- --  CREATININE 2.75* 3.00* 3.33* 3.30* 2.73* -- -- --  CALCIUM 8.9 8.8 8.6 9.0 8.7 -- -- --  MG -- -- -- -- -- 2.4 -- 1.7  PHOS -- -- -- -- -- 4.8* 3.7 1.0*   Liver Function Tests:  Lab 01/25/13 0402 01/24/13 1345 01/23/13 1450  AST 16 21 19   ALT 9 12 13   ALKPHOS 90 108 108  BILITOT 0.4 0.4 0.2*  PROT 5.9* 6.9 7.3  ALBUMIN 3.1* 3.9 4.0   CBC:  Lab 01/28/13 0605 01/27/13 1845 01/27/13 0545 01/26/13 1726 01/26/13 0610 01/25/13 0402  WBC 6.6 7.0 7.4 7.4 7.6 --  NEUTROABS -- -- -- -- -- 7.5  HGB 11.0* 11.0* 11.4* 11.2* 11.7* --  HCT 32.5* 31.8* 33.4* 32.6* 34.8* --  MCV 91.3 91.1 90.3 91.3 91.3 --  PLT 219 181 175 171 173 --   CBG:  Lab 01/30/13 0725 01/30/13 0405 01/29/13 2353 01/29/13 1958 01/29/13 1602  GLUCAP 94 97 81 101*  133*    Recent Results (from the past 240 hour(s))  MRSA PCR SCREENING     Status: Normal   Collection Time   01/23/13  7:45 PM      Component Value Range Status Comment   MRSA by PCR NEGATIVE  NEGATIVE Final   URINE CULTURE     Status: Normal   Collection Time   01/23/13  7:46 PM      Component Value Range Status Comment   Specimen Description URINE, CLEAN CATCH   Final    Special Requests NONE   Final    Culture  Setup Time 01/24/2013 01:22   Final    Colony Count NO GROWTH   Final    Culture NO GROWTH   Final    Report Status 01/25/2013 FINAL   Final      Scheduled Meds:    . pantoprazole (PROTONIX) IV  40 mg Intravenous Q12H   Continuous Infusions:    . sodium chloride      Principal Problem:  *Antifreeze poisoning Active Problems:  Acute respiratory failure  Hypernatremia  AKI (acute kidney injury)  Lactic acidosis   Hematuria  Suicide attempt by drug ingestion  Todd Fitzgerald  [facility] Pager [phone]. If 7PM-7AM, please contact night-coverage at [email or web address], password [facility] 01/30/2013, 10:10 AM  LOS: 7 days

## 2013-01-31 DIAGNOSIS — T50992A Poisoning by other drugs, medicaments and biological substances, intentional self-harm, initial encounter: Secondary | ICD-10-CM

## 2013-01-31 DIAGNOSIS — T50901A Poisoning by unspecified drugs, medicaments and biological substances, accidental (unintentional), initial encounter: Secondary | ICD-10-CM

## 2013-01-31 LAB — BASIC METABOLIC PANEL
CO2: 24 mEq/L (ref 19–32)
Calcium: 9.1 mg/dL (ref 8.4–10.5)
Creatinine, Ser: 2.52 mg/dL — ABNORMAL HIGH (ref 0.50–1.35)
Glucose, Bld: 100 mg/dL — ABNORMAL HIGH (ref 70–99)
Sodium: 138 mEq/L (ref 135–145)

## 2013-01-31 LAB — GLUCOSE, CAPILLARY: Glucose-Capillary: 111 mg/dL — ABNORMAL HIGH (ref 70–99)

## 2013-01-31 LAB — TSH: TSH: 0.702 u[IU]/mL (ref 0.350–4.500)

## 2013-01-31 MED ORDER — CITALOPRAM HYDROBROMIDE 10 MG PO TABS
25.0000 mg | ORAL_TABLET | Freq: Every day | ORAL | Status: DC
  Administered 2013-01-31 – 2013-02-01 (×2): 25 mg via ORAL
  Filled 2013-01-31 (×2): qty 1

## 2013-01-31 MED ORDER — PANTOPRAZOLE SODIUM 20 MG PO TBEC
20.0000 mg | DELAYED_RELEASE_TABLET | Freq: Two times a day (BID) | ORAL | Status: DC
  Administered 2013-01-31 – 2013-02-04 (×8): 20 mg via ORAL
  Filled 2013-01-31 (×10): qty 1

## 2013-01-31 MED ORDER — PANTOPRAZOLE SODIUM 20 MG PO TBEC: 20.0000 mg | DELAYED_RELEASE_TABLET | Freq: Two times a day (BID) | ORAL | Status: DC

## 2013-01-31 NOTE — Progress Notes (Signed)
Clinical Social Work Progress Note PSYCHIATRY SERVICE LINE 01/31/2013  Patient:  Todd Fitzgerald  Account:  [id number]  Admit Date:  01/23/2013  Clinical Social Worker:  Unk Lightning, LCSW  Date/Time:  01/31/2013 09:00 AM  Review of Patient  Overall Medical Condition:   Per chart review, patient medically stable to dc to inpatient psych facility.   Participation Level:  Minimal  Participation Quality  Resistant   Other Participation Quality:   Affect  Flat   Cognitive  Alert   Reaction to Medications/Concerns:   None reported   Modes of Intervention  Support  Confrontation   Summary of Progress/Plan at Discharge   CSW met with patient at bedside. Sitter still at bedside. Patient laying in bed and watching tv.    CSW spoke with patient regarding his weekend. Patient continues to report "I'm dead". CSW asked patient to elaborate on his statement and inquired if patient is feeling depressed today. Patient unable to answer the question. CSW asked if patient had spoken to family or if they had visited. Patient denied visitors but per chart review, family did visit over the weekend. CSW asked patient what he would do if he got to the leave the hospital today. Patient was unable to provide an answer and unable to respond if he would attempt to hurt himself or any others.    Per chart review, patient ready to dc to psychiatric hospital. Referrals have been sent and CSW will continue to follow up.

## 2013-01-31 NOTE — Progress Notes (Signed)
Patient noted to be restless in bed and not careful with IJ cath site. One stitch noted holding cath in place and dressing reinforced.

## 2013-01-31 NOTE — Progress Notes (Signed)
Clinical Social Work  CSW received call from [place] at [facility] inquiring about medication listed on patient's home med list of Isoniazid. [facility] reports this medication is typically used to treat TB. CSW spoke with bedside RN who reports no report of TB and patient has not received medication for TB. CSW called group home and spoke with Rosey Bath. Group home reports medication was not on MAR and they were not giving him medication. CSW shared this information with [name] who will evaluate patient.  [place], [place] [phone]

## 2013-01-31 NOTE — Progress Notes (Signed)
[facility] PROGRESS NOTE  CLEBURNE SAVINI QIO:[id number] DOB: 1961-09-08 DOA: 01/23/2013 PCP: No primary provider on file.  Assessment/Plan: 1. Ethylene glycol poisoning - resolved. 2. Suicidal ideation - asked me to assist him in helping him die this morning. SI present.  3. Acute on chronic renal failure - Stable, improving. No Foley and no IVF for 24 hours, Creatinine continues to improve. Discontinued HD line.  4. Inability to sleep - trazodone prn  Code Status: Full code Family Communication: None Disposition Plan: [facility] when available. Medically stable at this point.   Consultants:  Psychiatry   Nephrology - signed off  Procedures:  none  Antibiotics:  none   HPI/Subjective: - asked me this morning "help me die". When I declined, patient became agitated and started yelling.   Objective: Filed Vitals:   01/30/13 1415 01/30/13 1742 01/30/13 1959 01/31/13 0402  BP: 130/82 143/77 117/76 133/64  Pulse: 80 73 74 74  Temp: 98.1 F (36.7 C) 98.1 F (36.7 C) 98.1 F (36.7 C) 98.1 F (36.7 C)  TempSrc: Oral Oral Oral Oral  Resp: 19 19 19 20   Height:   5\' 8"  (1.727 m)   Weight:   95.754 kg (211 lb 1.6 oz)   SpO2: 97% 96% 95% 96%    Intake/Output Summary (Last 24 hours) at 01/31/13 0953 Last data filed at 01/31/13 0650  Gross per 24 hour  Intake 612.33 ml  Output   3800 ml  Net -3187.67 ml   Filed Weights   01/28/13 2118 01/29/13 1954 01/30/13 1959  Weight: 95.754 kg (211 lb 1.6 oz) 95.754 kg (211 lb 1.6 oz) 95.754 kg (211 lb 1.6 oz)    Exam:   General:  NAD, agitated  Cardiovascular: RRR, no MRG  Respiratory: CTA biL  Abdomen: non tender to palpation, BS+, soft, no rebound  Data Reviewed: Basic Metabolic Panel:  Lab 01/31/13 3244 01/30/13 0540 01/29/13 0920 01/28/13 0605 01/27/13 0545 01/25/13 0402 01/24/13 1301  NA 138 139 140 140 138 -- --  K 4.6 4.4 4.6 4.7 4.1 -- --  CL 105 105 109 109 106 -- --  CO2 24 23 22 25 23  -- --  GLUCOSE 100* 95  169* 102* 114* -- --  BUN 26* 25* 27* 26* 25* -- --  CREATININE 2.52* 2.75* 3.00* 3.33* 3.30* -- --  CALCIUM 9.1 8.9 8.8 8.6 9.0 -- --  MG -- -- -- -- -- 2.4 --  PHOS -- -- -- -- -- 4.8* 3.7   Liver Function Tests:  Lab 01/25/13 0402 01/24/13 1345  AST 16 21  ALT 9 12  ALKPHOS 90 108  BILITOT 0.4 0.4  PROT 5.9* 6.9  ALBUMIN 3.1* 3.9   CBC:  Lab 01/28/13 0605 01/27/13 1845 01/27/13 0545 01/26/13 1726 01/26/13 0610 01/25/13 0402  WBC 6.6 7.0 7.4 7.4 7.6 --  NEUTROABS -- -- -- -- -- 7.5  HGB 11.0* 11.0* 11.4* 11.2* 11.7* --  HCT 32.5* 31.8* 33.4* 32.6* 34.8* --  MCV 91.3 91.1 90.3 91.3 91.3 --  PLT 219 181 175 171 173 --   CBG:  Lab 01/31/13 0725 01/31/13 0358 01/30/13 2341 01/30/13 2002 01/30/13 1600  GLUCAP 111* 114* 99 119* 83    Recent Results (from the past 240 hour(s))  MRSA PCR SCREENING     Status: Normal   Collection Time   01/23/13  7:45 PM      Component Value Range Status Comment   MRSA by PCR NEGATIVE  NEGATIVE Final  URINE CULTURE     Status: Normal   Collection Time   01/23/13  7:46 PM      Component Value Range Status Comment   Specimen Description URINE, CLEAN CATCH   Final    Special Requests NONE   Final    Culture  Setup Time 01/24/2013 01:22   Final    Colony Count NO GROWTH   Final    Culture NO GROWTH   Final    Report Status 01/25/2013 FINAL   Final      Scheduled Meds:    . pantoprazole (PROTONIX) IV  40 mg Intravenous Q12H   Continuous Infusions:    . sodium chloride 20 mL/hr at 01/31/13 8469    Principal Problem:  *Antifreeze poisoning Active Problems:  Acute respiratory failure  Hypernatremia  AKI (acute kidney injury)  Lactic acidosis  Hematuria  Suicide attempt by drug ingestion  [name]  [facility] Pager [phone]. If 7PM-7AM, please contact night-coverage at [email or web address], password [facility] 01/31/2013, 9:53 AM  LOS: 8 days

## 2013-01-31 NOTE — Consult Note (Signed)
Patient Identification:  Todd Fitzgerald Date of Evaluation:  01/31/2013 Reason for Consult:  Antifreeze Suicide attempt  Referring Provider: Dr. [name]  History of Present Illness: pt is a resident of group home.  He was on a home leave when he compulsively drank antifreeze.  He declines to give reason but alludes to suicide attempt.   Past Psychiatric History Pt has swallowed antifreeze four times.  He has been in a group home but is unable to say for how long.   Past Medical History:     Past Medical History  Diagnosis Date  . Suicidal ideation     overdose drugs age 4, last 7 years antifreeze ingestion x 4 times  . CKD (chronic kidney disease)     questionable  . Heart murmur   . MDD (major depressive disorder)   . ED (erectile dysfunction)   . BPH (benign prostatic hyperplasia)   . PTSD (post-traumatic stress disorder)   . HLD (hyperlipidemia)   . Borderline personality disorder   . Schizoaffective disorder   . Ruptured disc, cervical       History reviewed. No pertinent past surgical history.  Allergies: No Known Allergies  Current Medications:  Prior to Admission medications   Medication Sig Start Date End Date Taking? Authorizing Provider  aspirin EC 81 MG tablet Take 81 mg by mouth at bedtime.   Yes Historical Provider, MD  atropine 1 % ophthalmic solution Place 2 drops under the tongue at bedtime.   Yes Historical Provider, MD  cholecalciferol (VITAMIN D) 400 UNITS TABS Take 800 Units by mouth at bedtime.    Yes Historical Provider, MD  fish oil-omega-3 fatty acids 1000 MG capsule Take 1 g by mouth at bedtime.    Yes Historical Provider, MD  gemfibrozil (LOPID) 600 MG tablet Take 600 mg by mouth 2 (two) times daily.   Yes Historical Provider, MD  isoniazid (NYDRAZID) 100 MG tablet Take 300 mg by mouth daily. on an empty stomach daily   Yes Historical Provider, MD  LORazepam (ATIVAN) 0.5 MG tablet Take 0.5 mg by mouth 2 (two) times daily.    Yes Historical Provider, MD   polyethylene glycol (MIRALAX / GLYCOLAX) packet Take 17 g by mouth daily.   Yes Historical Provider, MD  pyridOXINE (VITAMIN B-6) 100 MG tablet Take 50 mg by mouth daily.   Yes Historical Provider, MD  senna (SENOKOT) 8.6 MG tablet Take 2 tablets by mouth 2 (two) times daily.    Yes Historical Provider, MD  simvastatin (ZOCOR) 20 MG tablet Take 20 mg by mouth daily.   Yes Historical Provider, MD  Tamsulosin HCl (FLOMAX) 0.4 MG CAPS Take 0.4 mg by mouth daily after breakfast.   Yes Historical Provider, MD  traZODone (DESYREL) 100 MG tablet Take 200 mg by mouth at bedtime.    Yes Historical Provider, MD  pantoprazole (PROTONIX) 20 MG tablet Take 1 tablet (20 mg total) by mouth 2 (two) times daily. 01/31/13   [name], MD    Social History:    reports that he has been smoking Cigarettes.  He has a 2.5 pack-year smoking history. He does not have any smokeless tobacco history on file. He reports that he uses illicit drugs (Marijuana) about 5 times per week. He reports that he does not drink alcohol.   Family History:    History reviewed. No pertinent family history.  Mental Status Examination/Evaluation: Objective:  Appearance: obese, unshaven  Eye Contact::  Good  Speech:  Slow and repeats, "  I'm dead"  Volume:  Normal  Mood:  Depressed  Affect:  Blunt and Restricted  Thought Process:  Disorganized  Orientation:  Other:  Pt is oriented to person place and mood  Thought Content:  Rumination  Suicidal Thoughts:  No  Homicidal Thoughts:  No  Judgement:  Impaired  Insight:  Lacking   DIAGNOSIS:   AXIS I  Major Depression with suicide attempt; Poly-susbstance abuse  AXIS II  Borderline rPersonality Disorder with multiple suicide attempts  AXIS III See medical notes.  AXIS IV economic problems, housing problems, other psychosocial or environmental problems, problems related to social environment and Pt with paucity of resources has compulsive reaction to distress; swallows antifreeze.    AXIS V 41-50 serious symptoms   Assessment/Plan:   Discussed with  [name], Psych CSW, [name] @ [name] assessment Pt is awake, lethargic proclaiming "I'm Dead!".  He was unable to elaborate when invited to explain.  He then says he does not want to go to [name] after all had been discussed.  He denies SI.  He admits he drank antifreeze but cannot say why.  He may have initially taken antifreeze [with some pre-conceived notion, or not]  And then repeated this response when terribly distressed.   He has made four attempts to kill himself and this warrants further evaluation, and hopefully more receptive understanding on pt's part if he is admitted to inpt. Psychiatry.  Therefore,  IVC will be requested.    Inquiry has been made about Isoniazid on the pt's home list.  This was not revealed and no one has any knowledge when or why it was ordered or IF it had ever been taken.  This needs to be resolved before [name] may consider admissiion.  RECOMMENDATION:  1.  Pt. Is improved medically; consider starting SSRI, Lexapro, escitalopram 25 mg. **It is noted citalopram was ordered 25 mg.  Please consider increasing to 50 mg.  2.  Request IVC:   Pt is suddenly declining to transfer to [name].  Four potentially lethal attempts to drink antifreeze warrants transfer to Psychiatric facility such as [name]. 3.* Request rapid test for TB in am  Resolve question of why Isoniazid is on the home list of meds.  4.  Pending review per [name] 5.  Will follow pt.  [name] MD 01/31/2013 12:38 PM

## 2013-01-31 NOTE — Progress Notes (Signed)
Clinical Social Work  CSW spoke with [name] at [facility] who reports that patient's information is still being reviewed. CSW reminded [name] that Augusto Gamble is 2nd shift CSW and could be contacted if decision made. CSW gave Jody update on patient. Patient has been served and IVC paperwork in chart and valid for 7 days.  [place], [place] [phone]

## 2013-01-31 NOTE — Progress Notes (Signed)
Clinical Social Work  CSW continues to work on placement. CSW spoke with [facility] who reviewed referral and denied patient. CSW spoke with [facility] who reports that patient has not been reviewed yet but hopeful that beds are available today. CSW will continue to follow.  [place], [place] [phone]

## 2013-02-01 LAB — BASIC METABOLIC PANEL
BUN: 25 mg/dL — ABNORMAL HIGH (ref 6–23)
Chloride: 104 mEq/L (ref 96–112)
Creatinine, Ser: 2.09 mg/dL — ABNORMAL HIGH (ref 0.50–1.35)
GFR calc Af Amer: 41 mL/min — ABNORMAL LOW (ref >90)
Glucose, Bld: 101 mg/dL — ABNORMAL HIGH (ref 70–99)

## 2013-02-01 LAB — GLUCOSE, CAPILLARY
Glucose-Capillary: 110 mg/dL — ABNORMAL HIGH (ref 70–99)
Glucose-Capillary: 113 mg/dL — ABNORMAL HIGH (ref 70–99)
Glucose-Capillary: 177 mg/dL — ABNORMAL HIGH (ref 70–99)

## 2013-02-01 MED ORDER — CITALOPRAM HYDROBROMIDE 20 MG PO TABS
20.0000 mg | ORAL_TABLET | Freq: Every day | ORAL | Status: DC
  Administered 2013-02-02: 20 mg via ORAL
  Filled 2013-02-01: qty 1

## 2013-02-01 NOTE — Progress Notes (Signed)
Clinical Social Work Progress Note PSYCHIATRY SERVICE LINE 02/01/2013  Patient:  Todd Fitzgerald  Account:  [id number]  Admit Date:  01/23/2013  Clinical Social Worker:  Unk Lightning, LCSW  Date/Time:  02/01/2013 10:30 AM  Review of Patient  Overall Medical Condition:   Patient ready to dc per MD.   Participation Level:  Minimal  Participation Quality  Guarded   Other Participation Quality:   Affect  Flat   Cognitive  Alert   Reaction to Medications/Concerns:   None reported   Modes of Intervention  Support  Clarification   Summary of Progress/Plan at Discharge   CSW met with patient at bedside. Patient laying in bed and watching tv. Patient has sitter present but no visitors present.    Patient reports his mind, body and soul feel bad today. Patient is unable to elaborate on why he feels bad or to describe where he is hurting. Patient continues to report that he is "dead". CSW inquired about patient's plans at dc and how he can avoid any further suicide attempts. Patient reports that he does not like going home but is unable to use problem-solving skills to create a plan to ensure positive coping skills at home. Patient not fully engaged during session.    CSW continues to work on placement and will continue to follow.

## 2013-02-01 NOTE — Progress Notes (Signed)
Clinical Social Work  CSW received call from RN that group home had questions regarding patient's disposition. CSW spoke with Rosey Bath from group home. CSW explained disposition still pending regarding Long LOS meeting suggestion for patient to return home vs [facility]. Group home agreeable to accept patient whenever medically stable and CSW explained possibly ready as soon as tomorrow. CSW clarified that group home had made arrangements regarding revoking home visits and group home reports they have been coordinating with ACT team regarding plans. Group home reports that hospital will need to provide transportation for patient to get back to group home in [place] and need FL2. CSW will continue to follow to determine if patient is to return back to group home or to [facility]. CSW provided group home with CSW contact information again in case they need further information.  [place], [place] [phone]

## 2013-02-01 NOTE — Progress Notes (Signed)
Clinical Social Work  CSW called [facility] to determine bed status for patient. Assessments reports that they are awaiting consult from Dr. Ferol Luz today. CSW called Dr. Ferol Luz and asked her to speak with [facility] regarding patient's need for inpatient facility and MD agreeable to contact [facility]. CSW will continue to follow.  [place], [place] [phone]

## 2013-02-01 NOTE — Progress Notes (Signed)
Clinical Social Work  Patient was discussed in [employer] and CSW continues to discuss case with Interior and spatial designer [facility] Rife). Director recommends CSW call Health Department to determine if patient is on TB list. Per Health Department, no current active cases and patient not on TB list. Health Department reports if patient is on one medication for TB and it is being self-administered then TB is not active. If someone has active TB then patient would take 4 medications and RN would administer medications to ensure patient is being compliant.   Per RN, MD has ordered TB blood test. Dr. Jacky Kindle suggested that patient return to group home. CSW shared suggestion with Dr. Ferol Luz. CSW will continue to follow and will assist with dc planning once disposition is determined.  [place], [place] [phone]

## 2013-02-01 NOTE — Consult Note (Signed)
Patient Identification:  Todd Fitzgerald Date of Evaluation:  02/01/2013 Reason for Consult:  Follow up; Resolve hx Todd Fitzgerald  Referring Provider: Dr. Sheppard Fitzgerald  History of Present Illness:Pt was on 4-week leave from group home when he drank antifreeze.  He continues to says he doesn't know why he did. Apparently there had been some dispute between GF and pt.  GF has not been seen visiting pt.  He recently began saying he was 'dead' and continues saying he wants to be dead.  IVC paper has been issues.   Past Psychiatric History:Pt has history of drinking antifreeze 4  [total] times.  He is at greater risk of drinking a lethal amount.    Past Medical History:     Past Medical History  Diagnosis Date  . 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 CKD (chronic kidney disease)     questionable  . Heart murmur   . MDD (major depressive disorder)   . ED (erectile dysfunction)   . BPH (benign prostatic hyperplasia)   . PTSD (post-traumatic stress disorder)   . HLD (hyperlipidemia)   . Borderline personality disorder   . Schizoaffective disorder   . Ruptured disc, cervical       History reviewed. No pertinent past surgical history.  Allergies: No Known Allergies  Current Medications:  Prior to Admission medications   Medication Sig Start Date End Date Taking? Authorizing Provider  aspirin EC 81 MG tablet Take 81 mg by mouth at bedtime.   Yes Historical Provider, Fitzgerald  atropine 1 % ophthalmic solution Place 2 drops under the tongue at bedtime.   Yes Historical Provider, Fitzgerald  cholecalciferol (VITAMIN D) 400 UNITS TABS Take 800 Units by mouth at bedtime.    Yes Historical Provider, Fitzgerald  fish oil-omega-3 fatty acids 1000 MG capsule Take 1 g by mouth at bedtime.    Yes Historical Provider, Fitzgerald  gemfibrozil (LOPID) 600 MG tablet Take 600 mg by mouth 2 (two) times daily.   Yes Historical Provider, Fitzgerald  LORazepam (ATIVAN) 0.5 MG tablet Take 0.5 mg by mouth  2 (two) times daily.    Yes Historical Provider, Fitzgerald  polyethylene glycol (MIRALAX / GLYCOLAX) packet Take 17 g by mouth daily.   Yes Historical Provider, Fitzgerald  pyridOXINE (VITAMIN B-6) 100 MG tablet Take 50 mg by mouth daily.   Yes Historical Provider, Fitzgerald  senna (SENOKOT) 8.6 MG tablet Take 2 tablets by mouth 2 (two) times daily.    Yes Historical Provider, Fitzgerald  simvastatin (ZOCOR) 20 MG tablet Take 20 mg by mouth daily.   Yes Historical Provider, Fitzgerald  Tamsulosin HCl (FLOMAX) 0.4 MG CAPS Take 0.4 mg by mouth daily after breakfast.   Yes Historical Provider, Fitzgerald  traZODone (DESYREL) 100 MG tablet Take 200 mg by mouth at bedtime.    Yes Historical Provider, Fitzgerald  pantoprazole (PROTONIX) 20 MG tablet Take 1 tablet (20 mg total) by mouth 2 (two) times daily. 01/31/13   Todd Pert, Fitzgerald    Social History:    reports that he has been smoking Cigarettes.  He has a 2.5 pack-year smoking history. He does not have any smokeless tobacco history on file. He reports that he uses illicit drugs (Marijuana) about 5 times per week. He reports that he does not drink alcohol.   Family History:    History reviewed. No pertinent family history.  Mental Status Examination/Evaluation: Objective:  Appearance: obese  Eye  Contact::  Good  Speech:  Clear and Coherent and Slow  Volume:  Normal  Mood:  depressed  Affect:  Blunt and Depressed  Thought Process:  Disorganized  Orientation:  Other:  pt is focused upon dying   Thought Content:  Rumination  Suicidal Thoughts:  Yes.  without intent/plan  Homicidal Thoughts:  No  Judgement:  Impaired  Insight:  Lacking   DIAGNOSIS:   AXIS I  Major Depression with suicide attempt  AXIS II  Deferred  AXIS III See medical notes.  AXIS IV other psychosocial or environmental problems, problems related to social environment and problems with primary support group  AXIS V 41-50 serious symptoms   Assessment/Plan:  Discussed with Todd Fitzgerald, Head RN TB test is sent today and  results are expected to be reported in am.  Will continue to refer to [facility] if report is negative.  Continue IVC RECOMMENDATION:  1.  Awaiting report on TB tests since Isonizaid was in the home list of medications.  2.  Pt remains suicidal; IVC on record.  3.  [facility] waiting on test report; will follow pt. Todd Neidhardt Fitzgerald 02/01/2013 11:11 AM

## 2013-02-02 LAB — BASIC METABOLIC PANEL
BUN: 28 mg/dL — ABNORMAL HIGH (ref 6–23)
Calcium: 9.6 mg/dL (ref 8.4–10.5)
GFR calc Af Amer: 48 mL/min — ABNORMAL LOW (ref >90)
GFR calc non Af Amer: 41 mL/min — ABNORMAL LOW (ref >90)
Glucose, Bld: 104 mg/dL — ABNORMAL HIGH (ref 70–99)
Potassium: 4.3 mEq/L (ref 3.5–5.1)
Sodium: 139 mEq/L (ref 135–145)

## 2013-02-02 LAB — GLUCOSE, CAPILLARY
Glucose-Capillary: 110 mg/dL — ABNORMAL HIGH (ref 70–99)
Glucose-Capillary: 120 mg/dL — ABNORMAL HIGH (ref 70–99)
Glucose-Capillary: 98 mg/dL (ref 70–99)

## 2013-02-02 MED ORDER — CITALOPRAM HYDROBROMIDE 40 MG PO TABS
40.0000 mg | ORAL_TABLET | Freq: Every day | ORAL | Status: DC
  Administered 2013-02-03 – 2013-02-04 (×2): 40 mg via ORAL
  Filled 2013-02-02 (×2): qty 1

## 2013-02-02 NOTE — Progress Notes (Signed)
[facility] PROGRESS NOTE  Todd Fitzgerald EAV:[id number] DOB: March 26, 1961 DOA: 01/23/2013 PCP: No primary provider on file.  Assessment/Plan: 1. Ethylene glycol poisoning - resolved. 2. Suicidal ideation - asked me to assist him in helping him die this morning. SI present-patient will need behavioral health state prior to discharged safely home-increased citalopram to 40 mg per psychiatry recommendation, continue trazodone 50 each bedtime lorazepam 1 mg every 6 when necessary 3. Acute on chronic renal failure - Stable, improving. No Foley and no IVF for 24 hours, Creatinine continues to improve. Discontinued HD line.  4. Inability to sleep - trazodone prn  Code Status: Full code Family Communication: None Disposition Plan: [facility] when TB test returns  Consultants:  Psychiatry   Nephrology - signed off  Procedures:  none  Antibiotics:  none   HPI/Subjective: Fair eye contact, flat affect and no complaints. Passing stool urine   Objective: Filed Vitals:   02/01/13 2214 02/02/13 0534 02/02/13 1020 02/02/13 1304  BP: 134/74 117/74 149/81 131/82  Pulse: 71 72 70 76  Temp: 98.2 F (36.8 C) 98.2 F (36.8 C) 98.5 F (36.9 C) 98 F (36.7 C)  TempSrc: Oral Oral Oral Oral  Resp: 18 18 18 18   Height:      Weight: 92.7 kg (204 lb 5.9 oz)     SpO2: 96% 98% 96% 95%    Intake/Output Summary (Last 24 hours) at 02/02/13 1714 Last data filed at 02/02/13 0900  Gross per 24 hour  Intake      0 ml  Output      1 ml  Net     -1 ml   Filed Weights   01/30/13 1959 01/31/13 1951 02/01/13 2214  Weight: 95.754 kg (211 lb 1.6 oz) 95.751 kg (211 lb 1.5 oz) 92.7 kg (204 lb 5.9 oz)    Exam:   General:  NAD  Cardiovascular: RRR, no MRG  Respiratory: CTA biL  Abdomen: non tender to palpation, BS+, soft, no rebound  Data Reviewed: Basic Metabolic Panel:  Lab 02/02/13 7829 02/01/13 0620 01/31/13 0515 01/30/13 0540 01/29/13 0920  NA 139 138 138 139 140  K 4.3 4.3 4.6 4.4 4.6   CL 103 104 105 105 109  CO2 22 24 24 23 22   GLUCOSE 104* 101* 100* 95 169*  BUN 28* 25* 26* 25* 27*  CREATININE 1.85* 2.09* 2.52* 2.75* 3.00*  CALCIUM 9.6 9.1 9.1 8.9 8.8  MG -- -- -- -- --  PHOS -- -- -- -- --   Liver Function Tests: No results found for this basename: AST:5,ALT:5,ALKPHOS:5,BILITOT:5,PROT:5,ALBUMIN:5 in the last 168 hours CBC:  Lab 01/28/13 0605 01/27/13 1845 01/27/13 0545 01/26/13 1726  WBC 6.6 7.0 7.4 7.4  NEUTROABS -- -- -- --  HGB 11.0* 11.0* 11.4* 11.2*  HCT 32.5* 31.8* 33.4* 32.6*  MCV 91.3 91.1 90.3 91.3  PLT 219 181 175 171   CBG:  Lab 02/02/13 1207 02/02/13 0802 02/02/13 0357 02/02/13 0016 02/01/13 2005  GLUCAP 90 107* 110* 120* 123*    Recent Results (from the past 240 hour(s))  MRSA PCR SCREENING     Status: Normal   Collection Time   01/23/13  7:45 PM      Component Value Range Status Comment   MRSA by PCR NEGATIVE  NEGATIVE Final   URINE CULTURE     Status: Normal   Collection Time   01/23/13  7:46 PM      Component Value Range Status Comment   Specimen Description URINE, CLEAN  CATCH   Final    Special Requests NONE   Final    Culture  Setup Time 01/24/2013 01:22   Final    Colony Count NO GROWTH   Final    Culture NO GROWTH   Final    Report Status 01/25/2013 FINAL   Final      Scheduled Meds:    . citalopram  20 mg Oral Daily  . pantoprazole  20 mg Oral BID   Continuous Infusions:    . sodium chloride Stopped (01/31/13 1003)    Principal Problem:  *Antifreeze poisoning Active Problems:  Acute respiratory failure  Hypernatremia  AKI (acute kidney injury)  Lactic acidosis  Hematuria  Suicide attempt by drug ingestion  Todd Fitzgerald  [facility] Pager [phone]. If 7PM-7AM, please contact night-coverage at [email or web address], password [facility] 02/02/2013, 5:14 PM  LOS: 10 days

## 2013-02-02 NOTE — Progress Notes (Signed)
Clinical Social Work Progress Note PSYCHIATRY SERVICE LINE 02/02/2013  Patient:  Todd Fitzgerald  Account:  [id number]  Admit Date:  01/23/2013  Clinical Social Worker:  Unk Lightning, LCSW  Date/Time:  02/02/2013 12:15 PM  Review of Patient  Overall Medical Condition:   Continuing to wait to TB test results. Director of [id number] called lab who reports they will call charge RN once it is determined when results will be posted.   Participation Level:  Minimal  Participation Quality  Guarded   Other Participation Quality:   Affect  Flat   Cognitive  Alert   Reaction to Medications/Concerns:   None reported   Modes of Intervention  Support  Solution-Focused   Summary of Progress/Plan at Discharge   CSW met with patient at bedside. No family present but sitter in room.    Patient reports that he did not sleep well. Patient reports he was unable to stay asleep all night. Patient reports that he does not feel well but cannot describe how he is feeling. Patient reports that he is still "dead" but once again cannot elaborate on these feelings. Patient denies any SI or HI or psychotic features at this time.    CSW awaiting TB results. CSW will continue to staff case with psych MD to determine disposition and will keep guardian and group home updated once decision is made. CSW will continue to follow.

## 2013-02-03 ENCOUNTER — Inpatient Hospital Stay (HOSPITAL_COMMUNITY): Payer: Medicare Other

## 2013-02-03 ENCOUNTER — Telehealth (HOSPITAL_COMMUNITY): Payer: Self-pay | Admitting: *Deleted

## 2013-02-03 LAB — GLUCOSE, CAPILLARY
Glucose-Capillary: 100 mg/dL — ABNORMAL HIGH (ref 70–99)
Glucose-Capillary: 108 mg/dL — ABNORMAL HIGH (ref 70–99)

## 2013-02-03 LAB — BASIC METABOLIC PANEL
BUN: 27 mg/dL — ABNORMAL HIGH (ref 6–23)
CO2: 23 mEq/L (ref 19–32)
Calcium: 9.2 mg/dL (ref 8.4–10.5)
Creatinine, Ser: 1.61 mg/dL — ABNORMAL HIGH (ref 0.50–1.35)
GFR calc Af Amer: 56 mL/min — ABNORMAL LOW (ref >90)
GFR calc non Af Amer: 49 mL/min — ABNORMAL LOW (ref >90)
Glucose, Bld: 113 mg/dL — ABNORMAL HIGH (ref 70–99)
Potassium: 4.1 mEq/L (ref 3.5–5.1)
Sodium: 139 mEq/L (ref 135–145)

## 2013-02-03 MED ORDER — LORAZEPAM 1 MG PO TABS: 1.0000 mg | ORAL_TABLET | Freq: Four times a day (QID) | ORAL | Status: DC | PRN

## 2013-02-03 MED ORDER — CITALOPRAM HYDROBROMIDE 40 MG PO TABS: 40.0000 mg | ORAL_TABLET | Freq: Every day | ORAL | Status: DC

## 2013-02-03 MED ORDER — ISONIAZID 300 MG PO TABS: 300.0000 mg | ORAL_TABLET | Freq: Every day | ORAL | Status: DC

## 2013-02-03 MED ORDER — VITAMIN B-6 500 MG PO TABS: 500.0000 mg | ORAL_TABLET | Freq: Every day | ORAL | Status: DC

## 2013-02-03 NOTE — Progress Notes (Addendum)
Pt is exhibiting odd behavior per sitter. Pt suddenly incontinent and urinated all over the floor. Per day shift nurse, Toma Copier RN, pt left a BM on the floor in the bathroom. MD made aware. Pt denies suicidal/homicidal ideation at this time. Will continue to monitor. Gilman Schmidt

## 2013-02-03 NOTE — BH Assessment (Signed)
[facility] Assessment Progress Note      Accepted for [facility] admission by Eilene Ghazi MD, pending appropriate bed availability.

## 2013-02-03 NOTE — Progress Notes (Signed)
Clinical Social Work  Patient was discussed during [employer]. Dr Jacky Kindle suggested attending MD order ID consult to clear patient. CSW informed attending MD of suggestion. CSW reviewed chart but no results from TB test. CSW spoke with [facility] regarding patient admitted once test has been completed and [facility] reports to keep them updated and patient can be moved to the top of the list. CSW will continue to follow.  [place], [place] [phone]

## 2013-02-03 NOTE — Progress Notes (Signed)
Clinical Social Work  CSW spoke with MD who reports that patient has been cleared from ID and can dc. CSW spoke with [name] from [facility] who reports that patient has been asked to be reviewed by Dr. Lucianne Muss. CSW called Dr. Jacky Kindle to update him on status. CSW informed MD of [facility] response. CSW will continue to follow.  [place], [place] [phone]

## 2013-02-03 NOTE — Consult Note (Signed)
Patient Identification:  Todd Fitzgerald Date of Evaluation:  02/03/2013 Reason for Consult:  Antifreeze, TB test pending  Referring Provider:  Dr. Mahala Menghini  History of Present Illness:Pt lives in Group Home and has a 4 wk leave.  He was with his GF and states that he 'lost' her but is unwilling to elaborate.  It is noted that she has called at least once but has not been seen here.   Past Psychiatric History:He has lived in a groups home for some time.  It is noted that he has already been hospitalized for drinking antifreeze three other times.  He fails to understand the seriousness of these attemtps  Past Medical History:     Past Medical History  Diagnosis Date  . 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 CKD (chronic kidney disease)     questionable  . Heart murmur   . MDD (major depressive disorder)   . ED (erectile dysfunction)   . BPH (benign prostatic hyperplasia)   . PTSD (post-traumatic stress disorder)   . HLD (hyperlipidemia)   . Borderline personality disorder   . Schizoaffective disorder   . Ruptured disc, cervical       History reviewed. No pertinent past surgical history.  Allergies: No Known Allergies  Current Medications:  Prior to Admission medications   Medication Sig Start Date End Date Taking? Authorizing Provider  aspirin EC 81 MG tablet Take 81 mg by mouth at bedtime.   Yes Historical Provider, MD  atropine 1 % ophthalmic solution Place 2 drops under the tongue at bedtime.   Yes Historical Provider, MD  cholecalciferol (VITAMIN D) 400 UNITS TABS Take 800 Units by mouth at bedtime.    Yes Historical Provider, MD  fish oil-omega-3 fatty acids 1000 MG capsule Take 1 g by mouth at bedtime.    Yes Historical Provider, MD  gemfibrozil (LOPID) 600 MG tablet Take 600 mg by mouth 2 (two) times daily.   Yes Historical Provider, MD  LORazepam (ATIVAN) 0.5 MG tablet Take 0.5 mg by mouth 2 (two) times daily.    Yes  Historical Provider, MD  polyethylene glycol (MIRALAX / GLYCOLAX) packet Take 17 g by mouth daily.   Yes Historical Provider, MD  pyridOXINE (VITAMIN B-6) 100 MG tablet Take 50 mg by mouth daily.   Yes Historical Provider, MD  senna (SENOKOT) 8.6 MG tablet Take 2 tablets by mouth 2 (two) times daily.    Yes Historical Provider, MD  simvastatin (ZOCOR) 20 MG tablet Take 20 mg by mouth daily.   Yes Historical Provider, MD  Tamsulosin HCl (FLOMAX) 0.4 MG CAPS Take 0.4 mg by mouth daily after breakfast.   Yes Historical Provider, MD  traZODone (DESYREL) 100 MG tablet Take 200 mg by mouth at bedtime.    Yes Historical Provider, MD  pantoprazole (PROTONIX) 20 MG tablet Take 1 tablet (20 mg total) by mouth 2 (two) times daily. 01/31/13   [name], MD    Social History:    reports that he has been smoking Cigarettes.  He has a 2.5 pack-year smoking history. He does not have any smokeless tobacco history on file. He reports that he uses illicit drugs (Marijuana) about 5 times per week. He reports that he does not drink alcohol.   Family History:    History reviewed. No pertinent family history.  Mental Status Examination/Evaluation: Objective:  Appearance: Casual and obese  Eye Contact::  Good  Speech:  Clear and Coherent and Normal Rate  Volume:  Normal  Mood:    Affect:  Blunt and Congruent  Thought Process:  Linear  Orientation:  Other:  oriented to person; unable to appreciate significat risk of drinking antifreeze.  Thought Content:  Rumination and limited  Suicidal Thoughts:  Yes.  without intent/plan  Homicidal Thoughts:  No  Judgement:  Impaired  Insight:  Lacking   DIAGNOSIS:   AXIS I   Major Depression with Suicide Attempt  AXIS II  Deferred  AXIS III See medical notes.  AXIS IV educational problems, other psychosocial or environmental problems, problems related to social environment, problems with primary support group and Pt has no capacity to prevent repetitive overdoses  of antifreeze.  He continues to focus on dying.  He is very unhappy in his group home because he is teasteasing at group home  AXIS V 41-50 serious symptoms   Assessment/Plan: Discussed with Dr.Aronson, Dr. Josem Kaufmann, Dr. Mahala Menghini, Psych CSW Pt is lying in bed, alert.  He continues to say he is wanting to be dead and that he has suicide intentions.  He again declines to state or explain any precipitants to drinking antifreeze. He is very calm.  RECOMMENDATION:  1.  Pt does not have capacity as demonstrated by MSE and more so by repeated attempts to drink antifreeze.  2.  Pt has IVC papers filed.  3.  Question of TB, pending blood test is resolved by CXR - negative report and confirmation by Dr. Joanie Coddington. Ds. Who considers the likelihood of TB, given CXR, as negligible.  4.  Contacted [facility] with request to review possibility of transfer to [facility] - in am.  5.  No further psychiatric needs identified.  MD Psychiatrist signs off.  [name] MD 02/03/2013 12:58 PM

## 2013-02-03 NOTE — Discharge Summary (Signed)
Physician Discharge Summary  ALLEN EGERTON FAO:[id number] DOB: 03-Sep-1961 DOA: 01/23/2013  PCP: No primary provider on file.  Admit date: 01/23/2013 Discharge date: 02/03/2013  Time spent: 35 minutes  Discharge Diagnoses:  Principal Problem:  *Antifreeze poisoning Active Problems:  Acute respiratory failure  Hypernatremia  AKI (acute kidney injury)  Lactic acidosis  Hematuria  Suicide attempt by drug ingestion  Discharge Condition: stable  Diet recommendation: regular  Filed Weights   01/31/13 1951 02/01/13 2214 02/02/13 2012  Weight: 95.751 kg (211 lb 1.5 oz) 92.7 kg (204 lb 5.9 oz) 88.6 kg (195 lb 5.2 oz)   History of present illness:  52 y.o male brought to the [facility] ED earlier 1/26. His sister was called at 67 am by his girlfriend who stated the patient was acting delerious and funny and called 911. He was at his girlfriends home on a home visit from his group home today and started acting strange with thoughts he possibly ingested antifreeze. He has had toxic ingestions of antifreeze in the past multiple times. He has been hospitalized at [facility] x 3 years and [facility] hospital in the past. Patient lives in a [facility] group home in [place] since 05/03/2012.    Lab evaluation showed ABG 7.28 / 20 / 127, serum HCO3 10, chloride 102, Na 138, anion gap= 26. Past was given IV NaHCO3 3 amps and then IV bicarb gtt and transferred to [facility] for evaluation for possible diaysis. Also got fomepizole first dose around 2 pm on the day of admission. He was intubated after arrival here; he was communicative prior to intubation with mumbling speech per resident MD. UA showed 100 prot, mod LE, large Hgb, 3-6 wbc, tntc rbc's. Creat was 1.3  Hospital Course:  1. Ethylene glycol poisoning - Patient underwent urgent central line placement and hemodialysis on 27th and 28th. His ethylene glycol levels decreased following each HD sessions and his fomepizole was stopped. Patient received supportive  treatment and his renal function fortunately recovered to some extent and needed no further dialysis while here. His Foley and IV fluids were discontinued on 2/2 and his renal function continued to improve.  Suggest Rpt Bmet in 3-4 days 2. Suicidal ideation - psychiatry consulted while patient in the hospital. He continued to express suicidal ideation during his hospital stay.  Increased citalopram to 40 mg per psychiatry recommendation, continue trazodone 50 each bedtime lorazepam 1 mg every 6 when necessary 3. Acute on chronic renal failure - 2/2 ethylene glycol ingestion. His creatinine continued to improve. His Foley was discontinued on 2/2 and his IVF were KVO. Creatinine on d/c was 2.5 4. ?Latent TB state-discuss his care with Dr. 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 He needs to continue his INH and B6, but patient has no restrictions on movement or restrictions with regards to hospitalization and therapies needed-Quanitferon TB test was done but this test woulde come back as positive given history of TB in the past once again he has latent TB  Procedures:  hemodialysis 01/24/13, 01/25/13.  Consultations:  Nephrology  Psychiatry  Telephone consulted infectious disease 02/03/39  Discharge Exam: Filed Vitals:   02/02/13 1824 02/02/13 2012 02/03/13 0408 02/03/13 0935  BP: 160/73 147/77 138/75 140/73  Pulse: 74 72 69 80  Temp: 98 F (36.7 C) 98 F (36.7 C) 98.3 F (36.8 C) 98.3 F (36.8 C)  TempSrc: Oral Oral Oral  Oral  Resp: 18 18 18 20   Height:      Weight:  88.6 kg (195 lb 5.2 oz)    SpO2: 95% 97% 95% 98%   Flat affect, staring habitus  General: NAD Cardiovascular: RRR Respiratory: CTA biL    Medication List     As of 02/03/2013  1:59 PM    STOP taking these medications         benztropine 1 MG tablet   Commonly known as: COGENTIN      cloZAPine 100 MG tablet    Commonly known as: CLOZARIL      haloperidol 5 MG tablet   Commonly known as: HALDOL      hydrOXYzine 50 MG tablet   Commonly known as: ATARAX/VISTARIL      lamoTRIgine 100 MG tablet   Commonly known as: LAMICTAL      ranitidine 150 MG tablet   Commonly known as: ZANTAC      selegiline 12 MG/24HR   Commonly known as: EMSAM      topiramate 50 MG tablet   Commonly known as: TOPAMAX      vardenafil 10 MG tablet   Commonly known as: LEVITRA      TAKE these medications         aspirin EC 81 MG tablet   Take 81 mg by mouth at bedtime.      atropine 1 % ophthalmic solution   Place 2 drops under the tongue at bedtime.      cholecalciferol 400 UNITS Tabs   Commonly known as: VITAMIN D   Take 800 Units by mouth at bedtime.      citalopram 40 MG tablet   Commonly known as: CELEXA   Take 1 tablet (40 mg total) by mouth daily.      fish oil-omega-3 fatty acids 1000 MG capsule   Take 1 g by mouth at bedtime.      gemfibrozil 600 MG tablet   Commonly known as: LOPID   Take 600 mg by mouth 2 (two) times daily.      isoniazid 300 MG tablet   Commonly known as: NYDRAZID   Take 1 tablet (300 mg total) by mouth daily.      LORazepam 1 MG tablet   Commonly known as: ATIVAN   Take 1 tablet (1 mg total) by mouth every 6 (six) hours as needed for anxiety.      LORazepam 0.5 MG tablet   Commonly known as: ATIVAN   Take 0.5 mg by mouth 2 (two) times daily.      pantoprazole 20 MG tablet   Commonly known as: PROTONIX   Take 1 tablet (20 mg total) by mouth 2 (two) times daily.      polyethylene glycol packet   Commonly known as: MIRALAX / GLYCOLAX   Take 17 g by mouth daily.      senna 8.6 MG tablet   Commonly known as: SENOKOT   Take 2 tablets by mouth 2 (two) times daily.      simvastatin 20 MG tablet   Commonly known as: ZOCOR   Take 20 mg by mouth daily.      Tamsulosin HCl 0.4 MG Caps   Commonly known as: FLOMAX   Take 0.4 mg by mouth daily after breakfast.       traZODone 100 MG tablet   Commonly known as: DESYREL   Take 200 mg by mouth at bedtime.      vitamin B-6 500 MG tablet  Take 1 tablet (500 mg total) by mouth daily.      pyridOXINE 100 MG tablet   Commonly known as: VITAMIN B-6   Take 50 mg by mouth daily.         The results of significant diagnostics from this hospitalization (including imaging, microbiology, ancillary and laboratory) are listed below for reference.    Significant Diagnostic Studies: Dg Chest Port 1 View  01/24/2013  *RADIOLOGY REPORT*  Clinical Data: Ventilator patient.  Intubation.  PORTABLE CHEST - 1 VIEW  Comparison: 01/23/2013.  Findings: 0830 hours.  Endotracheal tube tip is in the mid trachea. There is a right IJ central venous catheter with its tip overlying the upper SVC.  A nasogastric tube projects below the diaphragm. There are slightly lower lung volumes with increased probable bibasilar atelectasis.  No pneumothorax or significant pleural effusion is seen.  The heart size and mediastinal contours are stable allowing for positional differences.  IMPRESSION:  1.  Support system positioned as above. 2.  Mildly increased bibasilar atelectasis.   Original Report Authenticated By: Carey Bullocks, M.D.    Dg Chest Port 1 View  01/23/2013  *RADIOLOGY REPORT*  Clinical Data: Status post intubation and dialysis catheter placement.  PORTABLE CHEST - 1 VIEW  Comparison: None.  Findings: ET tube is in good position with the tip at the level of the clavicular heads.  Tip of right IJ approach dialysis catheter projects over the upper superior vena cava.  Lungs are clear.  No pneumothorax.  Heart size normal.  IMPRESSION:  1.  ET tube in good position. 2.  Tip of right IJ approach dialysis catheter projects over the upper superior vena cava.  No pneumothorax. 3.  No acute finding.   Original Report Authenticated By: Holley Dexter, M.D.    Dg Abd 2 Views  01/25/2013  *RADIOLOGY REPORT*  Clinical Data: Abdominal pain and  distension.  ABDOMEN - 2 VIEW  Comparison: 10/22/2008  Findings: There is a moderate to large stool burden.  Flecks of high attenuation may reflect ingested debris within the colon. Relative paucity of small bowel gas.  No free intraperitoneal air. No acute osseous finding.  IMPRESSION: Moderate to large stool burden.  Relative paucity of small bowel gas may reflect decompressed or fluid filled loops.   Original Report Authenticated By: Jearld Lesch, M.D.     Microbiology: No results found for this or any previous visit (from the past 240 hour(s)). Labs: Basic Metabolic Panel:  Lab 02/03/13 1610 02/02/13 0620 02/01/13 0620 01/31/13 0515 01/30/13 0540  NA 139 139 138 138 139  K 4.1 4.3 4.3 4.6 4.4  CL 104 103 104 105 105  CO2 23 22 24 24 23   GLUCOSE 113* 104* 101* 100* 95  BUN 27* 28* 25* 26* 25*  CREATININE 1.61* 1.85* 2.09* 2.52* 2.75*  CALCIUM 9.2 9.6 9.1 9.1 8.9  MG -- -- -- -- --  PHOS -- -- -- -- --   Liver Function Tests: No results found for this basename: AST:5,ALT:5,ALKPHOS:5,BILITOT:5,PROT:5,ALBUMIN:5 in the last 168 hours CBC:  Lab 01/28/13 0605 01/27/13 1845  WBC 6.6 7.0  NEUTROABS -- --  HGB 11.0* 11.0*  HCT 32.5* 31.8*  MCV 91.3 91.1  PLT 219 181   CBG:  Lab 02/03/13 1114 02/03/13 0737 02/03/13 0410 02/03/13 0014 02/02/13 2011  GLUCAP 105* 112* 100* 125* 117*   Signed:  Rhetta Mura  [facility] 02/03/2013, 1:53 PM

## 2013-02-03 NOTE — Progress Notes (Signed)
Per [name] at [facility], pt has been accepted pending availability on the 400 hall, 02/04/13.  Psych CSW informed and will f/u in am.

## 2013-02-03 NOTE — BH Assessment (Signed)
Assessment Note   Todd Fitzgerald is an 52 y.o. male. Pt OD for 4 th time 01/23/2013 on antifreeze. Pt was on leave from group home to visit girlfriend, she reported he was acting funny and pt taken to ED at [facility]. Pt in kidney failure and delirious and sent to [facility] ICU for treatment. Today pt is medically stable for [facility], group home will accept him back when psychiatrically stable. Per Unk Lightning LCSW:Patient reports his mind, body and soul feel bad today. Patient is unable to elaborate on why he feels bad or to describe where he is hurting. Patient continues to report that he is "dead". CSW inquired about patient's plans at dc and how he can avoid any further suicide attempts. Patient reports that he does not like going home but is unable to use problem-solving skills to create a plan to ensure positive coping skills at home. Patient not fully engaged during session.    History of present illness:51 y.o male brought to the [facility] ED earlier 1/26. His sister was called at 45 am by his girlfriend who stated the patient was acting delerious and funny and called 911. He was at his girlfriends home on a home visit from his group home today and started acting strange with thoughts he possibly ingested antifreeze. He has had toxic ingestions of antifreeze in the past multiple times. He has been hospitalized at [facility] 3 years and [facility] hospital in the past. Patient lives in a [facility] group home in [place] since 05/03/2012.  reports that he has been smoking Cigarettes.  He has a 2.5 pack-year smoking history. He does not have any smokeless tobacco history on file. He reports that he uses illicit drugs (Marijuana) about 5 times per week. He reports that he does not drink alcohol. Suicidal ideation - psychiatry consulted while patient in the hospital. He continued to express suicidal ideation during his hospital stay.  Increased citalopram to 40 mg per psychiatry recommendation, continue  trazodone 50 each bedtime lorazepam 1 mg every 6 when necessary  Accepted for inpatient admission by Ceasar Mons MD, pending appropriate bed.  Axis I: Major Depression, Recurrent severe Axis II: Deferred Axis III:  Past Medical History  Diagnosis Date  . Suicidal ideation     overdose drugs age 26, last 7 years antifreeze ingestion x 4 times  . CKD (chronic kidney disease)     questionable  . Heart murmur   . MDD (major depressive disorder)   . ED (erectile dysfunction)   . BPH (benign prostatic hyperplasia)   . PTSD (post-traumatic stress disorder)   . HLD (hyperlipidemia)   . Borderline personality disorder   . Schizoaffective disorder   . Ruptured disc, cervical    Axis IV: other psychosocial or environmental problems and problems related to social environment Axis V: 21-30 behavior considerably influenced by delusions or hallucinations OR serious impairment in judgment, communication OR inability to function in almost all areas  Past Medical History:  Past Medical History  Diagnosis Date  . Suicidal ideation     overdose drugs age 6, last 7 years antifreeze ingestion x 4 times  . CKD (chronic kidney disease)     questionable  . Heart murmur   . MDD (major depressive disorder)   . ED (erectile dysfunction)   . BPH (benign prostatic hyperplasia)   . PTSD (post-traumatic stress disorder)   . HLD (hyperlipidemia)   . Borderline personality disorder   . Schizoaffective disorder   .  Ruptured disc, cervical     No past surgical history on file.  Family History: No family history on file.  Social History:  reports that he has been smoking Cigarettes.  He has a 2.5 pack-year smoking history. He does not have any smokeless tobacco history on file. He reports that he uses illicit drugs (Marijuana) about 5 times per week. He reports that he does not drink alcohol.  Additional Social History:  Alcohol / Drug Use Pain Medications: not abusing Prescriptions: not abusing,  given by group home Over the Counter: nos, OD on antifreeze x 4 History of alcohol / drug use?: Yes Substance #1 Name of Substance 1: marijuana 1 - Age of First Use:  nos 1 - Amount (size/oz): nos 1 - Frequency: 5 x week until hospitalization 1 - Duration: years 1 - Last Use / Amount: 01/23/2013  CIWA:   COWS:    Allergies: No Known Allergies  Home Medications:  (Not in a hospital admission)  OB/GYN Status:  No LMP for male patient.  General Assessment Data Location of Assessment: [facility] Assessment Services Living Arrangements: Other (Comment) (group home) Can pt return to current living arrangement?: Yes Admission Status: Voluntary Is patient capable of signing voluntary admission?: Yes Transfer from: Acute Hospital Referral Source: MD  Education Status Is patient currently in school?: No  Risk to self Suicidal Ideation: Yes-Currently Present Suicidal Intent: Yes-Currently Present Is patient at risk for suicide?: Yes Suicidal Plan?: Yes-Currently Present Specify Current Suicidal Plan: drink antifreeze Access to Means: Yes Specify Access to Suicidal Means: when left group home on leave found some What has been your use of drugs/alcohol within the last 12 months?: marijuana Previous Attempts/Gestures: Yes How many times?: 4  Other Self Harm Risks: poor judgement, impulsive Triggers for Past Attempts: Unknown Intentional Self Injurious Behavior: None Family Suicide History: Unknown Recent stressful life event(s): Other (Comment) (none identified) Persecutory voices/beliefs?: No Depression: Yes Depression Symptoms: Despondent;Fatigue;Feeling worthless/self pity;Loss of interest in usual pleasures Substance abuse history and/or treatment for substance abuse?: Yes Suicide prevention information given to non-admitted patients: Not applicable  Risk to Others Homicidal Ideation: No Thoughts of Harm to Others: No Current Homicidal Intent: No Current Homicidal Plan:  No Access to Homicidal Means: No History of harm to others?: No Assessment of Violence: None Noted Does patient have access to weapons?: No Criminal Charges Pending?: No Does patient have a court date: No  Psychosis Hallucinations:  (had delirium following ingestion antifreeze) Delusions: Somatic ("I'm dead")  Mental Status Report Appear/Hygiene: Other (Comment) (in hospital since 01/23/2013) Eye Contact: Poor Motor Activity: Psychomotor retardation Speech: Incoherent Level of Consciousness: Quiet/awake Mood: Depressed;Anxious;Despair Affect: Depressed;Blunted Anxiety Level: Minimal Thought Processes: Circumstantial Judgement: Impaired Orientation: Place;Time;Person Obsessive Compulsive Thoughts/Behaviors: Minimal  Cognitive Functioning Concentration: Decreased Memory: Remote Intact;Recent Impaired IQ: Average Insight: Poor Impulse Control: Poor Appetite: Fair Weight Loss: 0  Weight Gain: 0  Sleep: Decreased Total Hours of Sleep:  (always some difficulty without medication) Vegetative Symptoms: None  ADLScreening [facility] Assessment Services) Patient's cognitive ability adequate to safely complete daily activities?: Yes Patient able to express need for assistance with ADLs?: Yes (recent history of incontinence while inpt medical) Independently performs ADLs?: Yes (appropriate for developmental age)  Abuse/Neglect [facility] Physical Abuse: Denies Verbal Abuse: Yes, present (Comment) Sexual Abuse: Denies  Prior Inpatient Therapy Prior Inpatient Therapy: Yes Prior Therapy Dates: multiple last 2013 Prior Therapy Facilty/Provider(s): [facility], [facility] Reason for Treatment: suicidal/depression  Prior Outpatient Therapy Prior Outpatient Therapy: Yes Prior Therapy Dates: current  Prior Therapy Facilty/Provider(s): group home Reason for Treatment: depression  ADL Screening (condition at time of admission) Patient's cognitive ability adequate to safely complete daily  activities?: Yes Patient able to express need for assistance with ADLs?: Yes (recent history of incontinence while inpt medical) Independently performs ADLs?: Yes (appropriate for developmental age) Weakness of Legs: None Weakness of Arms/Hands: None  Home Assistive Devices/Equipment Home Assistive Devices/Equipment: None    Abuse/Neglect Assessment (Assessment to be complete while patient is alone) Physical Abuse: Denies Verbal Abuse: Yes, present (Comment) Sexual Abuse: Denies       Nutrition Screen- [facility] Adult/WL/AP Patient's home diet: Regular Have you recently lost weight without trying?: No Have you been eating poorly because of a decreased appetite?: No Malnutrition Screening Tool Score: 0   Additional Information 1:1 In Past 12 Months?: Yes (current in medical unit) CIRT Risk: No Elopement Risk: No Does patient have medical clearance?: Yes     Disposition:  Disposition Disposition of Patient: Inpatient treatment program Type of inpatient treatment program: Adult  On Site Evaluation by:   Reviewed with Physician:     Conan Bowens 02/03/2013 7:19 PM

## 2013-02-04 ENCOUNTER — Encounter (HOSPITAL_COMMUNITY): Payer: Self-pay

## 2013-02-04 ENCOUNTER — Inpatient Hospital Stay (HOSPITAL_COMMUNITY)
Admission: AD | Admit: 2013-02-04 | Discharge: 2013-02-11 | DRG: 885 | Payer: Medicare Other | Source: Intra-hospital | Attending: Psychiatry | Admitting: Psychiatry

## 2013-02-04 DIAGNOSIS — T6591XA Toxic effect of unspecified substance, accidental (unintentional), initial encounter: Secondary | ICD-10-CM

## 2013-02-04 DIAGNOSIS — Z733 Stress, not elsewhere classified: Secondary | ICD-10-CM

## 2013-02-04 DIAGNOSIS — E872 Acidosis, unspecified: Secondary | ICD-10-CM

## 2013-02-04 DIAGNOSIS — Z79899 Other long term (current) drug therapy: Secondary | ICD-10-CM

## 2013-02-04 DIAGNOSIS — N189 Chronic kidney disease, unspecified: Secondary | ICD-10-CM | POA: Diagnosis present

## 2013-02-04 DIAGNOSIS — F39 Unspecified mood [affective] disorder: Secondary | ICD-10-CM

## 2013-02-04 DIAGNOSIS — N179 Acute kidney failure, unspecified: Secondary | ICD-10-CM

## 2013-02-04 DIAGNOSIS — R319 Hematuria, unspecified: Secondary | ICD-10-CM

## 2013-02-04 DIAGNOSIS — Z91199 Patient's noncompliance with other medical treatment and regimen due to unspecified reason: Secondary | ICD-10-CM

## 2013-02-04 DIAGNOSIS — J9600 Acute respiratory failure, unspecified whether with hypoxia or hypercapnia: Secondary | ICD-10-CM

## 2013-02-04 DIAGNOSIS — R45851 Suicidal ideations: Secondary | ICD-10-CM

## 2013-02-04 DIAGNOSIS — F339 Major depressive disorder, recurrent, unspecified: Secondary | ICD-10-CM

## 2013-02-04 DIAGNOSIS — F332 Major depressive disorder, recurrent severe without psychotic features: Principal | ICD-10-CM | POA: Diagnosis present

## 2013-02-04 DIAGNOSIS — E870 Hyperosmolality and hypernatremia: Secondary | ICD-10-CM

## 2013-02-04 DIAGNOSIS — Z9119 Patient's noncompliance with other medical treatment and regimen: Secondary | ICD-10-CM

## 2013-02-04 DIAGNOSIS — T50902A Poisoning by unspecified drugs, medicaments and biological substances, intentional self-harm, initial encounter: Secondary | ICD-10-CM | POA: Diagnosis present

## 2013-02-04 LAB — BASIC METABOLIC PANEL
BUN: 25 mg/dL — ABNORMAL HIGH (ref 6–23)
GFR calc Af Amer: 59 mL/min — ABNORMAL LOW (ref >90)
GFR calc non Af Amer: 51 mL/min — ABNORMAL LOW (ref >90)
Potassium: 4.2 mEq/L (ref 3.5–5.1)

## 2013-02-04 LAB — QUANTIFERON TB GOLD ASSAY (BLOOD): Interferon Gamma Release Assay: NEGATIVE

## 2013-02-04 LAB — GLUCOSE, CAPILLARY
Glucose-Capillary: 100 mg/dL — ABNORMAL HIGH (ref 70–99)
Glucose-Capillary: 120 mg/dL — ABNORMAL HIGH (ref 70–99)

## 2013-02-04 MED ORDER — ALUM & MAG HYDROXIDE-SIMETH 200-200-20 MG/5ML PO SUSP: 30.0000 mL | ORAL | Status: DC | PRN

## 2013-02-04 MED ORDER — PANTOPRAZOLE SODIUM 20 MG PO TBEC
20.0000 mg | DELAYED_RELEASE_TABLET | Freq: Every day | ORAL | Status: DC
  Administered 2013-02-05 – 2013-02-11 (×7): 20 mg via ORAL
  Filled 2013-02-04 (×10): qty 1
  Filled 2013-02-04: qty 3

## 2013-02-04 MED ORDER — ACETAMINOPHEN 325 MG PO TABS: 650.0000 mg | ORAL_TABLET | Freq: Four times a day (QID) | ORAL | Status: DC | PRN

## 2013-02-04 MED ORDER — ACETAMINOPHEN 325 MG PO TABS
650.0000 mg | ORAL_TABLET | Freq: Four times a day (QID) | ORAL | Status: DC | PRN
  Administered 2013-02-10: 650 mg via ORAL

## 2013-02-04 MED ORDER — CITALOPRAM HYDROBROMIDE 40 MG PO TABS
40.0000 mg | ORAL_TABLET | Freq: Every day | ORAL | Status: DC
  Administered 2013-02-05 – 2013-02-11 (×7): 40 mg via ORAL
  Filled 2013-02-04 (×3): qty 1
  Filled 2013-02-04: qty 3
  Filled 2013-02-04 (×7): qty 1

## 2013-02-04 MED ORDER — MAGNESIUM HYDROXIDE 400 MG/5ML PO SUSP: 30.0000 mL | Freq: Every day | ORAL | Status: DC | PRN

## 2013-02-04 MED ORDER — TRAZODONE HCL 50 MG PO TABS
50.0000 mg | ORAL_TABLET | Freq: Every evening | ORAL | Status: DC | PRN
  Administered 2013-02-05 – 2013-02-08 (×6): 50 mg via ORAL
  Filled 2013-02-04 (×6): qty 1

## 2013-02-04 MED ORDER — CITALOPRAM HYDROBROMIDE 40 MG PO TABS
40.0000 mg | ORAL_TABLET | Freq: Every day | ORAL | Status: DC
  Filled 2013-02-04 (×2): qty 1

## 2013-02-04 NOTE — Progress Notes (Signed)
Reviewed patient No further changes-see d/c summary from yesterday 2.6.14  Pleas Koch, MD [facility] [phone]

## 2013-02-04 NOTE — Tx Team (Signed)
Initial Interdisciplinary Treatment Plan  PATIENT STRENGTHS: (choose at least two) Physical Health Supportive family/friends  PATIENT STRESSORS: Financial difficulties Medication change or noncompliance   PROBLEM LIST: Problem List/Patient Goals Date to be addressed Date deferred Reason deferred Estimated date of resolution  Suicidal Ideations      Anxiety                                                 DISCHARGE CRITERIA:  Improved stabilization in mood, thinking, and/or behavior Need for constant or close observation no longer present  PRELIMINARY DISCHARGE PLAN: Attend aftercare/continuing care group Outpatient therapy  PATIENT/FAMIILY INVOLVEMENT: This treatment plan has been presented to and reviewed with the patient, Todd Fitzgerald, and/or family member, .  The patient and family have been given the opportunity to ask questions and make suggestions.  Noah Charon 02/04/2013, 5:16 PM

## 2013-02-04 NOTE — Progress Notes (Signed)
Patient was discharged with police security to [facility]. Patient was discharged with IVC and discharge paperwork. Report was called prior to patients discharge.  Patient was stable upon discharge.

## 2013-02-04 NOTE — Progress Notes (Signed)
Clinical Child psychotherapist spoke with Tom at [facility].  Per Tom, no bed availability this morning.  CSW will follow up this afternoon.   Angelia Mould, MSW, [place] [phone]

## 2013-02-04 NOTE — Significant Event (Signed)
CBG:64 Treatment: 15 GM carbohydrate snack  Symptoms: None  Follow-up CBG: Time: 1301 CBG Result:120  Possible Reasons for Event: Unknown  Comments/MD notified:

## 2013-02-04 NOTE — Progress Notes (Signed)
Clinical Social Worker received notification from [place] at [facility] that pt has been accepted and a bed is available in room number: 4011.  CSW submitted IVC paperwork to Tim.  CSW updated unit CSW, unit RNCM, and RN.  RN to phone report to [phone].  Pt to transport via [employer].   Angelia Mould, MSW, [place] [phone]

## 2013-02-04 NOTE — Progress Notes (Signed)
Patient compliant with admission assessment. Patient denies SI/HI, denies AVH at this time. Patient oriented to self, disoriented to place, time and situation. Patient states "I live in a group home in [place] but I don't know the name of it." Patient states "the other person(resident) at the group home was yelling at me so I drank the antifreeze to try to kill myself." Patient verbalizes "I want to go back back home to my Mother's house, I lived there until two months ago when I moved to the group home." Patient verbalizes "I don't want to stay here." Patient given support and encouragement. Patient has no teeth, states "I left my teeth at my Mother's house but I can eat without them." Patient educated re "low" fall risk, verbalizes understanding. Patient oriented to unit/staff/room. Patient safe on unit with Q15 minute checks for safety. Will continue to monitor.

## 2013-02-04 NOTE — Progress Notes (Signed)
Patient ID: Todd Fitzgerald, male   DOB: Jun 26, 1961, 52 y.o.   MRN: [id number]  D: Pt denies SI/HI/AVH. Pt is  Cooperative. Pt seems to be responding to internal stimuli, even though he denies. Pt is oriented to place and time, but not to year. Pt defecated on self and was sitting in the feces on the chair. Pt had to be told to wash, and was physically washed in the shower by the mht. Pt seems to be developmentally slow.   A: Pt was offered support and encouragement. . Pt was encourage to attend groups. Q 15 minute checks were done for safety.   R:. Pt is taking medication. Pt receptive to treatment and safety maintained on unit.

## 2013-02-05 ENCOUNTER — Encounter (HOSPITAL_COMMUNITY): Payer: Self-pay | Admitting: Psychiatry

## 2013-02-05 DIAGNOSIS — F39 Unspecified mood [affective] disorder: Secondary | ICD-10-CM | POA: Diagnosis present

## 2013-02-05 DIAGNOSIS — F339 Major depressive disorder, recurrent, unspecified: Secondary | ICD-10-CM

## 2013-02-05 MED ORDER — TAMSULOSIN HCL 0.4 MG PO CAPS
0.4000 mg | ORAL_CAPSULE | Freq: Every day | ORAL | Status: DC
  Administered 2013-02-05 – 2013-02-11 (×7): 0.4 mg via ORAL
  Filled 2013-02-05: qty 1
  Filled 2013-02-05: qty 3
  Filled 2013-02-05 (×8): qty 1

## 2013-02-05 MED ORDER — GEMFIBROZIL 600 MG PO TABS
600.0000 mg | ORAL_TABLET | Freq: Two times a day (BID) | ORAL | Status: DC
  Administered 2013-02-05 – 2013-02-11 (×13): 600 mg via ORAL
  Filled 2013-02-05 (×11): qty 1
  Filled 2013-02-05: qty 6
  Filled 2013-02-05 (×5): qty 1
  Filled 2013-02-05: qty 6
  Filled 2013-02-05 (×2): qty 1

## 2013-02-05 MED ORDER — OMEGA-3-ACID ETHYL ESTERS 1 G PO CAPS
1.0000 g | ORAL_CAPSULE | Freq: Every day | ORAL | Status: DC
  Administered 2013-02-05 – 2013-02-10 (×6): 1 g via ORAL
  Filled 2013-02-05 (×5): qty 1
  Filled 2013-02-05: qty 3
  Filled 2013-02-05 (×4): qty 1

## 2013-02-05 MED ORDER — VITAMIN B-6 50 MG PO TABS
50.0000 mg | ORAL_TABLET | Freq: Every day | ORAL | Status: DC
  Administered 2013-02-06 – 2013-02-11 (×6): 50 mg via ORAL
  Filled 2013-02-05 (×7): qty 1
  Filled 2013-02-05: qty 3
  Filled 2013-02-05 (×2): qty 1

## 2013-02-05 MED ORDER — CHOLECALCIFEROL 10 MCG (400 UNIT) PO TABS
800.0000 [IU] | ORAL_TABLET | Freq: Every day | ORAL | Status: DC
  Administered 2013-02-05 – 2013-02-10 (×6): 800 [IU] via ORAL
  Filled 2013-02-05: qty 6
  Filled 2013-02-05 (×8): qty 2

## 2013-02-05 MED ORDER — ASPIRIN EC 81 MG PO TBEC
81.0000 mg | DELAYED_RELEASE_TABLET | Freq: Every day | ORAL | Status: DC
  Administered 2013-02-05 – 2013-02-10 (×6): 81 mg via ORAL
  Filled 2013-02-05 (×2): qty 1
  Filled 2013-02-05: qty 3
  Filled 2013-02-05 (×6): qty 1

## 2013-02-05 MED ORDER — POLYETHYLENE GLYCOL 3350 17 G PO PACK
17.0000 g | PACK | Freq: Every day | ORAL | Status: DC
  Filled 2013-02-05 (×5): qty 1

## 2013-02-05 MED ORDER — SENNA 8.6 MG PO TABS
2.0000 | ORAL_TABLET | Freq: Two times a day (BID) | ORAL | Status: DC
  Filled 2013-02-05 (×2): qty 2
  Filled 2013-02-05: qty 1
  Filled 2013-02-05 (×5): qty 2

## 2013-02-05 MED ORDER — SIMVASTATIN 20 MG PO TABS
20.0000 mg | ORAL_TABLET | Freq: Every day | ORAL | Status: DC
  Administered 2013-02-05 – 2013-02-11 (×7): 20 mg via ORAL
  Filled 2013-02-05 (×6): qty 1
  Filled 2013-02-05: qty 3
  Filled 2013-02-05 (×4): qty 1

## 2013-02-05 NOTE — H&P (Signed)
Psychiatric Admission Assessment Adult  Patient Identification:  Todd Fitzgerald  Date of Evaluation:  02/05/2013  Chief Complaint:  296.33 MDD  History of Present Illness: This is a 52 year old Caucasian male, admitted to [facility] from the [facility] with complaints of suicide attempt by drinking anti-freeze. Patient reports, "I was taken to the [facility] last week by the sheriff because I drank anti-freeze. I was trying to kill myself. I wanted to die too. I got tired of living. I don't know why I am tired of leaving. But I'm sorry ma'am for what I did. I started thinking about killing myself a long time ago. I have tried 3 other times to kill myself. I drank anti-freeze on those times too. Please forgive ma'am. I bought the anti-freeze myself. Please forgive, ma'am, I'm sorry. I have been depressed for a long time. I have been in this hospital previous. I don't know why I became depressed, I know I'm depressed ma'am. I don't use drugs, but I drink a lot of alcohol. When I drink alcohol, I get drunk too".  Mr. Blackshire currently denies any auditory, visual hallucinations, delusional thoughts and or paranoia. He denies feeling homicidal now or in the past. He appears intellectually limited, however able to answer pertinent questions appropriately. A fair historian to some extent. He constantly apologized over and over during this assessment for attempting to commit suicide to the point that it became unusually repetitious and uncontrollable.    Elements:  Location:  [facility] adult unit. Quality:  "Thoughts of suicide, attempted suicide, tired of living". Severity:  "My depression was at #10 when I drank the anti-freeze".. Timing:  "I drank anti-freeze to kill myself last week".. Duration:  "I have been depressed for a long time". Context:  "I felt like I don't want live no more, Got tired of living, sad all the time, don't want to do nothing any more".  Associated Signs/Synptoms:  Depression  Symptoms:  depressed mood, anhedonia, insomnia, feelings of worthlessness/guilt, hopelessness, suicidal thoughts with specific plan, suicidal attempt,  (Hypo) Manic Symptoms:  Irritable Mood,  Anxiety Symptoms:  Excessive Worry,  Psychotic Symptoms:  Hallucinations: Denies  PTSD Symptoms: Had a traumatic exposure:  Denies  Psychiatric Specialty Exam: Physical Exam  Constitutional: He is oriented to person, place, and time. He appears well-developed.  HENT:  Head: Normocephalic.  Eyes: Pupils are equal, round, and reactive to light.  Neck: Normal range of motion.  Cardiovascular: Normal rate and regular rhythm.   Respiratory: Effort normal.  GI: Soft.  Musculoskeletal: Normal range of motion.  Neurological: He is alert and oriented to person, place, and time.  Skin: Skin is warm and dry.  Psychiatric:  Patient appears to be intellectually limited, may be mental retardation?.    Review of Systems  Constitutional: Negative.   HENT: Negative.   Eyes: Negative.   Respiratory: Negative.   Cardiovascular: Negative.   Gastrointestinal: Negative.   Genitourinary: Negative.   Musculoskeletal: Negative.   Skin: Negative.   Neurological: Negative.   Endo/Heme/Allergies: Negative.   Psychiatric/Behavioral: Positive for depression and suicidal ideas. Negative for hallucinations and memory loss. The patient is nervous/anxious and has insomnia.     Blood pressure 151/84, pulse 79, temperature 98 F (36.7 C), temperature source Oral, resp. rate 20, height 5\' 8"  (1.727 m), weight 91.627 kg (202 lb).Body mass index is 30.72 kg/(m^2).  General Appearance: Disheveled  Eye Contact::  Fair  Speech:  Clear and Coherent and Slow, monotone  Volume:  Decreased  Mood:  Depressed  Affect:  Restricted  Thought Process:  Disorganized  Orientation:  Full (Time, Place, and Person)  Thought Content:  Rumination and Denies hallucinations, delusions and or paranoia.  Suicidal Thoughts:  Yes.   without intent/plan  Homicidal Thoughts:  No  Memory:  Immediate;   Fair Recent;   Fair Remote;   Poor  Judgement:  Impaired  Insight:  Lacking  Psychomotor Activity:  Quiet  Concentration:  Fair  Recall:  Fair  Akathisia:  No  Handed:  Right  AIMS (if indicated):     Assets:  Desire for Improvement  Sleep:  Number of Hours: 3.75    Past Psychiatric History: Diagnosis: Major depressive disorder, recurrent episode.  Hospitalizations: [facility] x 2  Outpatient Care: "I don't remember"  Substance Abuse Care: Denies  Self-Mutilation: Denies  Suicidal Attempts: "Yes, I drank anti-freeze so that I can die".  Violent Behaviors: Denies.   Past Medical History:   Past Medical History  Diagnosis Date  . 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 CKD (chronic kidney disease)     questionable  . Heart murmur   . MDD (major depressive disorder)   . ED (erectile dysfunction)   . BPH (benign prostatic hyperplasia)   . PTSD (post-traumatic stress disorder)   . HLD (hyperlipidemia)   . Borderline personality disorder   . Schizoaffective disorder   . Ruptured disc, cervical    Cardiac History:  Hx of heart murmur  Allergies:  No Known Allergies  PTA Medications: Prescriptions prior to admission  Medication Sig Dispense Refill  . aspirin EC 81 MG tablet Take 81 mg by mouth at bedtime.      Marland Kitchen atropine 1 % ophthalmic solution Place 2 drops under the tongue at bedtime.      . cholecalciferol (VITAMIN D) 400 UNITS TABS Take 800 Units by mouth at bedtime.       . citalopram (CELEXA) 40 MG tablet Take 1 tablet (40 mg total) by mouth daily.  30 tablet    . fish oil-omega-3 fatty acids 1000 MG capsule Take 1 g by mouth at bedtime.       Marland Kitchen gemfibrozil (LOPID) 600 MG tablet Take 600 mg by mouth 2 (two) times daily.      Marland Kitchen isoniazid (NYDRAZID) 300 MG tablet Take 1 tablet (300 mg total) by mouth daily.  30 tablet  0  . LORazepam (ATIVAN) 0.5 MG  tablet Take 0.5 mg by mouth 2 (two) times daily.       Marland Kitchen LORazepam (ATIVAN) 1 MG tablet Take 1 tablet (1 mg total) by mouth every 6 (six) hours as needed for anxiety.  30 tablet  0  . pantoprazole (PROTONIX) 20 MG tablet Take 1 tablet (20 mg total) by mouth 2 (two) times daily.  180 tablet  1  . polyethylene glycol (MIRALAX / GLYCOLAX) packet Take 17 g by mouth daily.      Marland Kitchen pyridOXINE (VITAMIN B-6) 100 MG tablet Take 50 mg by mouth daily.      . Pyridoxine HCl (VITAMIN B-6) 500 MG tablet Take 1 tablet (500 mg total) by mouth daily.      Marland Kitchen senna (SENOKOT) 8.6 MG tablet Take 2 tablets by mouth 2 (two) times daily.       . simvastatin (ZOCOR) 20 MG tablet Take 20 mg by mouth daily.      . Tamsulosin HCl (  FLOMAX) 0.4 MG CAPS Take 0.4 mg by mouth daily after breakfast.      . traZODone (DESYREL) 100 MG tablet Take 200 mg by mouth at bedtime.         Previous Psychotropic Medications:  Medication/Dose  See medication lists above.               Substance Abuse History in the last 12 months:  yes  Consequences of Substance Abuse: Medical Consequences:  Liver damage, Possible death by overdose Legal Consequences:  Arrests, jail time, Loss of driving privilege. Family Consequences:  Family discord, divorce and or separation.  Social History:  reports that he has been smoking Cigarettes.  He has a 2.5 pack-year smoking history. He does not have any smokeless tobacco history on file. He reports that he uses illicit drugs (Marijuana) about 5 times per week. He reports that he does not drink alcohol. Additional Social History: Pain Medications: not abusing Prescriptions: not abusing, given by group home Over the Counter: nos, OD on antifreeze x 4 History of alcohol / drug use?: Yes Name of Substance 1: marijuana 1 - Age of First Use:  nos 1 - Amount (size/oz): nos 1 - Frequency: 5 x week until hospitalization 1 - Duration: years 1 - Last Use / Amount: 01/23/2013  Current Place of  Residence: [place] per patient's reports, however, documentation indicated that patient lives in a group home in [place], [place].   Place of Birth:  [place]  Family Members: "My girl-friend"  Marital Status:  Single  Children: 3  Sons: 2  Daughters: 1  Relationships: "I have a girl-friend"  Education:  HS Graduate per patient's report.  Educational Problems/Performance: States that he completed high school.  Religious Beliefs/Practices: None reported  History of Abuse (Emotional/Phsycial/Sexual): None reported  Occupational Experiences: Employed  Military History:  None.  Legal History: None reported or denied.  Hobbies/Interests: None reported  Family History:  History reviewed. No pertinent family history.  Results for orders placed during the hospital encounter of 01/23/13 (from the past 72 hour(s))  GLUCOSE, CAPILLARY     Status: None   Collection Time    02/02/13 12:07 PM      Result Value Range   Glucose-Capillary 90  70 - 99 mg/dL  GLUCOSE, CAPILLARY     Status: None   Collection Time    02/02/13  5:19 PM      Result Value Range   Glucose-Capillary 98  70 - 99 mg/dL  GLUCOSE, CAPILLARY     Status: Abnormal   Collection Time    02/02/13  8:11 PM      Result Value Range   Glucose-Capillary 117 (*) 70 - 99 mg/dL  GLUCOSE, CAPILLARY     Status: Abnormal   Collection Time    02/03/13 12:14 AM      Result Value Range   Glucose-Capillary 125 (*) 70 - 99 mg/dL  GLUCOSE, CAPILLARY     Status: Abnormal   Collection Time    02/03/13  4:10 AM      Result Value Range   Glucose-Capillary 100 (*) 70 - 99 mg/dL  BASIC METABOLIC PANEL     Status: Abnormal   Collection Time    02/03/13  5:45 AM      Result Value Range   Sodium 139  135 - 145 mEq/L   Potassium 4.1  3.5 - 5.1 mEq/L   Chloride 104  96 - 112 mEq/L   CO2 23  19 - 32 mEq/L  Glucose, Bld 113 (*) 70 - 99 mg/dL   BUN 27 (*) 6 - 23 mg/dL   Creatinine, Ser 1.61 (*) 0.50 - 1.35 mg/dL   Calcium 9.2  8.4 - 09.6  mg/dL   GFR calc non Af Amer 49 (*) >90 mL/min   GFR calc Af Amer 56 (*) >90 mL/min   Comment:            The eGFR has been calculated     using the CKD EPI equation.     This calculation has not been     validated in all clinical     situations.     eGFR's persistently     <90 mL/min signify     possible Chronic Kidney Disease.  GLUCOSE, CAPILLARY     Status: Abnormal   Collection Time    02/03/13  7:37 AM      Result Value Range   Glucose-Capillary 112 (*) 70 - 99 mg/dL   Comment 1 Documented in Chart    GLUCOSE, CAPILLARY     Status: Abnormal   Collection Time    02/03/13 11:14 AM      Result Value Range   Glucose-Capillary 105 (*) 70 - 99 mg/dL   Comment 1 Documented in Chart    GLUCOSE, CAPILLARY     Status: Abnormal   Collection Time    02/03/13  5:06 PM      Result Value Range   Glucose-Capillary 108 (*) 70 - 99 mg/dL  GLUCOSE, CAPILLARY     Status: Abnormal   Collection Time    02/03/13  8:36 PM      Result Value Range   Glucose-Capillary 112 (*) 70 - 99 mg/dL  GLUCOSE, CAPILLARY     Status: Abnormal   Collection Time    02/04/13 12:06 AM      Result Value Range   Glucose-Capillary 100 (*) 70 - 99 mg/dL  GLUCOSE, CAPILLARY     Status: None   Collection Time    02/04/13  4:31 AM      Result Value Range   Glucose-Capillary 96  70 - 99 mg/dL  BASIC METABOLIC PANEL     Status: Abnormal   Collection Time    02/04/13  4:40 AM      Result Value Range   Sodium 140  135 - 145 mEq/L   Potassium 4.2  3.5 - 5.1 mEq/L   Chloride 105  96 - 112 mEq/L   CO2 25  19 - 32 mEq/L   Glucose, Bld 108 (*) 70 - 99 mg/dL   BUN 25 (*) 6 - 23 mg/dL   Creatinine, Ser 0.45 (*) 0.50 - 1.35 mg/dL   Calcium 9.3  8.4 - 40.9 mg/dL   GFR calc non Af Amer 51 (*) >90 mL/min   GFR calc Af Amer 59 (*) >90 mL/min   Comment:            The eGFR has been calculated     using the CKD EPI equation.     This calculation has not been     validated in all clinical     situations.     eGFR's  persistently     <90 mL/min signify     possible Chronic Kidney Disease.  GLUCOSE, CAPILLARY     Status: None   Collection Time    02/04/13  8:03 AM      Result Value Range   Glucose-Capillary 98  70 - 99 mg/dL  GLUCOSE, CAPILLARY     Status: Abnormal   Collection Time    02/04/13 12:11 PM      Result Value Range   Glucose-Capillary 64 (*) 70 - 99 mg/dL  GLUCOSE, CAPILLARY     Status: Abnormal   Collection Time    02/04/13  1:01 PM      Result Value Range   Glucose-Capillary 120 (*) 70 - 99 mg/dL   Psychological Evaluations:  Assessment:   AXIS I:  Major deoressive disorder, recurrent episode AXIS II:  Deferred AXIS III:   Past Medical History  Diagnosis Date  . Suicidal ideation     overdose drugs age 55, last 7 years antifreeze ingestion x 4 times  . CKD (chronic kidney disease)     questionable  . Heart murmur   . MDD (major depressive disorder)   . ED (erectile dysfunction)   . BPH (benign prostatic hyperplasia)   . PTSD (post-traumatic stress disorder)   . HLD (hyperlipidemia)   . Borderline personality disorder   . Schizoaffective disorder   . Ruptured disc, cervical    AXIS IV:  Other psychosocial problems. AXIS V:  11-20 some danger of hurting self or others possible OR occasionally fails to maintain minimal personal hygiene OR gross impairment in communication  Treatment Plan/Recommendations: 1. Admit for crisis management and stabilization, estimated length of stay 5-7 days.  2. Medication management to reduce current symptoms to base line and improve the patient's overall level of functioning  3. Treat health problems as indicated.  4. Develop treatment plan to decrease risk of and the need for readmission.  5. Psycho-social education regarding health maintenance and self care.  6. Health care follow up as needed for medical problems.  7. Review, reconcile, and reinstate any pertinent home medications for other health issues where appropriate. 8. Call for  consults with hospitalist for any additional specialty patient care services as needed.  Treatment Plan Summary: Daily contact with patient to assess and evaluate symptoms and progress in treatment Medication management  Current Medications:  Current Facility-Administered Medications  Medication Dose Route Frequency Provider Last Rate Last Dose  . acetaminophen (TYLENOL) tablet 650 mg  650 mg Oral Q6H PRN Larena Sox, MD      . acetaminophen (TYLENOL) tablet 650 mg  650 mg Oral Q6H PRN Shuvon Rankin, NP      . alum & mag hydroxide-simeth (MAALOX/MYLANTA) 200-200-20 MG/5ML suspension 30 mL  30 mL Oral Q4H PRN Larena Sox, MD      . alum & mag hydroxide-simeth (MAALOX/MYLANTA) 200-200-20 MG/5ML suspension 30 mL  30 mL Oral Q4H PRN Shuvon Rankin, NP      . citalopram (CELEXA) tablet 40 mg  40 mg Oral Daily Shuvon Rankin, NP   40 mg at 02/05/13 0801  . magnesium hydroxide (MILK OF MAGNESIA) suspension 30 mL  30 mL Oral Daily PRN Larena Sox, MD      . magnesium hydroxide (MILK OF MAGNESIA) suspension 30 mL  30 mL Oral Daily PRN Shuvon Rankin, NP      . pantoprazole (PROTONIX) EC tablet 20 mg  20 mg Oral Daily Larena Sox, MD   20 mg at 02/05/13 0801  . traZODone (DESYREL) tablet 50 mg  50 mg Oral QHS PRN,MR X 1 Larena Sox, MD        Observation Level/Precautions:  15 minute checks  Laboratory:  Reviewed ED lab findings on file  Psychotherapy:  Group sessions.  Medications:  See  medication lists  Consultations:  None indicated at this time.  Discharge Concerns:  Safety  Estimated LOS: 5-7 days  Other:     I certify that inpatient services furnished can reasonably be expected to improve the patient's condition.   Armandina Stammer I 2/8/20149:03 AM

## 2013-02-05 NOTE — Progress Notes (Signed)
Psychoeducational Group Note  Date:  02/05/2013 Time:  0945 am  Group Topic/Focus:  Identifying Needs:   The focus of this group is to help patients identify their personal needs that have been historically problematic and identify healthy behaviors to address their needs.  Participation Level:  Minimal  Participation Quality:  Redirectable  Affect:  Blunted  Cognitive:  Lacking  Insight:  Lacking  Engagement in Group:  Lacking and Poor  Additional Comments:    Andrena Mews 02/05/2013,11:56 AM

## 2013-02-05 NOTE — Clinical Social Work Note (Signed)
[facility] Group Notes:  (Clinical Social Work)  02/05/2013   11:15-11:45AM  Summary of Progress/Problems:   The main focus of today's process group was for the patient to identify ways in which they have in the past sabotaged their own recovery and reasons they may have done this/what they received from doing it.  Motivational interviewing techniques were utilized to explore motivations and plans to avoid self-sabotage when discharged from the hospital for this admission.  The patient expressed that his motivation is 10 out of 10.  He maintained a stare throughout the group, and said he had nothing to talk about despite being given the opportunity several times.  Type of Therapy:  Music Therapy with processing done  Participation Level:  Minimal  Participation Quality:  Attentive  Affect:  Blunted  Cognitive:  Confused  Insight:  Limited  Engagement in Therapy:  Limited  Modes of Intervention:   Socialization, Support and Processing, Exploration, Education, Rapport Building   [employer], LCSW 02/05/2013, 1:02 PM

## 2013-02-05 NOTE — [facility] Suicide Risk Assessment (Signed)
Suicide Risk Assessment  Admission Assessment     Nursing information obtained from:    Demographic factors:    Current Mental Status:    Loss Factors:    Historical Factors:    Risk Reduction Factors:     CLINICAL FACTORS:   Depression:   Impulsivity  COGNITIVE FEATURES THAT CONTRIBUTE TO RISK:  Closed-mindedness Thought constriction (tunnel vision)    SUICIDE RISK:   Moderate:  Frequent suicidal ideation with limited intensity, and duration, some specificity in terms of plans, no associated intent, good self-control, limited dysphoria/symptomatology, some risk factors present, and identifiable protective factors, including available and accessible social support.  PLAN OF CARE: Supportive approach/coping skills                              Optimize treatment with medications  I certify that inpatient services furnished can reasonably be expected to improve the patient's condition.  Keyleen Cerrato A 02/05/2013, 3:41 PM

## 2013-02-05 NOTE — Progress Notes (Signed)
Psychoeducational Group Note  Date:  02/05/2013 Time:0930am  Group Topic/Focus:  Identifying Needs:   The focus of this group is to help patients identify their personal needs that have been historically problematic and identify healthy behaviors to address their needs.  Participation Level:  Did Not Attend  Participation Quality:    Affect:    Cognitive:   Insight: Engagement in Group: Additional Comments: inventory group   Todd Fitzgerald 02/05/2013,10:21 AM

## 2013-02-05 NOTE — Progress Notes (Signed)
D   Pt has some cognitive limits and has difficulty doing things for himself and needs a lot of coaxing and prompting   He was incontinent of stool and was having diarrhea while he was standing in the bathroom waiting for a bath  He said he cant wipe or clean himself and acts like he doesn't know he is deficating on himself   He has concrete thinking and is very childlike  He attended group but did not really participate  He is depressed and still feel s suicidal he did contract for safety  He said he would drink antifreeze as his method for killing himself A   Verbal support given  Give simple instructions and assist with ADL's   Medications given and effectiveness monitored  Held his laxatives and notified pa of problem with diarrhea    Q 15 min checks R   Pt safe at present

## 2013-02-06 MED ORDER — HALOPERIDOL 1 MG PO TABS
0.5000 mg | ORAL_TABLET | Freq: Two times a day (BID) | ORAL | Status: DC | PRN
  Administered 2013-02-06 – 2013-02-07 (×3): 0.5 mg via ORAL
  Filled 2013-02-06 (×3): qty 1

## 2013-02-06 NOTE — Progress Notes (Signed)
D   Pt remains needful of constant redirection and instruction A   Pt on 1;1 R   Safe at present

## 2013-02-06 NOTE — Progress Notes (Signed)
[facility] Group Notes:  (Nursing/MHT/Case Management/Adjunct)  Date:  02/06/2013  Time:  2:25 PM  Type of Therapy: MHT Group  Participation Level:  Did Not Attend  Summary of Progress/Problems:  Todd Fitzgerald 02/06/2013, 2:25 PM

## 2013-02-06 NOTE — Progress Notes (Signed)
D  No change from previous assessment   Needful of constant redirection and instruction A   Pt on 1:1 R  Safe at present

## 2013-02-06 NOTE — Progress Notes (Signed)
[facility] MD Progress Note  02/06/2013 12:27 PM EVEREST BROD  MRN:  [id number]  Subjective: Mr. Jessen is on 1:1 supervision for safety and assist with ADLs. Per nurse's report, patient was apparently urinating and defecating on the floor in his room last night. He was seen and assessed while lying down in his bed this morning. His 1:1 sitter present. Mr. Ithiel is awake, communicative and making good eye contact. He states that he is doing pretty good. Rated depression at #3 today. States that he did get up this morning and had breakfast in the day room, however, he thought the day room was the cafeteria as he referred it as such. He denies SIHI, AVH. He states that he has had no diarrheal stools this am".   Diagnosis:   Axis I: Major depressive disorder, recurrent episode Axis II: Mental retardation, severity unknown Axis III:  Past Medical History  Diagnosis Date  . 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 CKD (chronic kidney disease)     questionable  . Heart murmur   . MDD (major depressive disorder)   . ED (erectile dysfunction)   . BPH (benign prostatic hyperplasia)   . PTSD (post-traumatic stress disorder)   . HLD (hyperlipidemia)   . Borderline personality disorder   . Schizoaffective disorder   . Ruptured disc, cervical    Axis IV: other psychosocial or environmental problems Axis V: 41-50 serious symptoms  ADL's:  Impaired  Sleep: Poor  Appetite:  Good  Suicidal Ideation:  Denies Homicidal Ideation:  Denies  AEB (as evidenced by): per patient's report.  Psychiatric Specialty Exam: Review of Systems  Constitutional: Negative.   HENT: Negative.   Eyes: Negative.   Cardiovascular: Negative.   Gastrointestinal: Positive for diarrhea.  Genitourinary: Negative.   Musculoskeletal: Negative.   Neurological: Negative.   Endo/Heme/Allergies: Negative.   Psychiatric/Behavioral: Positive for depression and substance abuse.  Negative for suicidal ideas, hallucinations and memory loss. The patient is not nervous/anxious and does not have insomnia.     Blood pressure 155/79, pulse 83, temperature 98.3 F (36.8 C), temperature source Oral, resp. rate 20, height 5\' 8"  (1.727 m), weight 91.627 kg (202 lb).Body mass index is 30.72 kg/(m^2).  General Appearance: Disheveled  Eye Contact::  Good  Speech:  Clear and Coherent  Volume:  Decreased  Mood:  Depressed, rated #4  Affect:  Flat  Thought Process:  Coherent  Orientation:  Other:  person and place  Thought Content:  Rumination  Suicidal Thoughts:  No  Homicidal Thoughts:  No  Memory:  Immediate;   Fair Recent;   Fair Remote;   Poor  Judgement:  Impaired  Insight:  Shallow  Psychomotor Activity:  Calm  Concentration:  Fair  Recall:  Fair  Akathisia:  No  Handed:  Right  AIMS (if indicated):     Assets:  Desire for Improvement  Sleep:  Number of Hours: 1.5   Current Medications: Current Facility-Administered Medications  Medication Dose Route Frequency Provider Last Rate Last Dose  . acetaminophen (TYLENOL) tablet 650 mg  650 mg Oral Q6H PRN Larena Sox, MD      . acetaminophen (TYLENOL) tablet 650 mg  650 mg Oral Q6H PRN Shuvon Rankin, NP      . alum & mag hydroxide-simeth (MAALOX/MYLANTA) 200-200-20 MG/5ML suspension 30 mL  30 mL Oral Q4H PRN Larena Sox, MD      . alum &  mag hydroxide-simeth (MAALOX/MYLANTA) 200-200-20 MG/5ML suspension 30 mL  30 mL Oral Q4H PRN Shuvon Rankin, NP      . aspirin EC tablet 81 mg  81 mg Oral QHS Sanjuana Kava, NP   81 mg at 02/05/13 2225  . cholecalciferol (VITAMIN D) tablet 800 Units  800 Units Oral QHS Sanjuana Kava, NP   800 Units at 02/05/13 2225  . citalopram (CELEXA) tablet 40 mg  40 mg Oral Daily Shuvon Rankin, NP   40 mg at 02/06/13 0800  . gemfibrozil (LOPID) tablet 600 mg  600 mg Oral BID Sanjuana Kava, NP   600 mg at 02/06/13 0801  . haloperidol (HALDOL) tablet 0.5 mg  0.5 mg Oral BID PRN Sanjuana Kava, NP   0.5 mg at 02/06/13 0158  . magnesium hydroxide (MILK OF MAGNESIA) suspension 30 mL  30 mL Oral Daily PRN Shuvon Rankin, NP      . omega-3 acid ethyl esters (LOVAZA) capsule 1 g  1 g Oral QHS Sanjuana Kava, NP   1 g at 02/05/13 2225  . pantoprazole (PROTONIX) EC tablet 20 mg  20 mg Oral Daily Larena Sox, MD   20 mg at 02/06/13 0800  . polyethylene glycol (MIRALAX / GLYCOLAX) packet 17 g  17 g Oral Daily Sanjuana Kava, NP      . pyridOXINE (VITAMIN B-6) tablet 50 mg  50 mg Oral Daily Sanjuana Kava, NP   50 mg at 02/06/13 0800  . senna (SENOKOT) tablet 17.2 mg  2 tablet Oral BID Sanjuana Kava, NP      . simvastatin (ZOCOR) tablet 20 mg  20 mg Oral Daily Sanjuana Kava, NP   20 mg at 02/06/13 0802  . Tamsulosin HCl (FLOMAX) capsule 0.4 mg  0.4 mg Oral Daily Sanjuana Kava, NP   0.4 mg at 02/06/13 0800  . traZODone (DESYREL) tablet 50 mg  50 mg Oral QHS PRN,MR X 1 Larena Sox, MD   50 mg at 02/06/13 0110    Lab Results:  Results for orders placed during the hospital encounter of 01/23/13 (from the past 48 hour(s))  GLUCOSE, CAPILLARY     Status: Abnormal   Collection Time    02/04/13  1:01 PM      Result Value Range   Glucose-Capillary 120 (*) 70 - 99 mg/dL    Physical Findings: AIMS: Facial and Oral Movements Muscles of Facial Expression: None, normal Lips and Perioral Area: None, normal Jaw: None, normal Tongue: None, normal,Extremity Movements Upper (arms, wrists, hands, fingers): None, normal Lower (legs, knees, ankles, toes): None, normal, Trunk Movements Neck, shoulders, hips: None, normal, Overall Severity Severity of abnormal movements (highest score from questions above): None, normal Incapacitation due to abnormal movements: None, normal Patient's awareness of abnormal movements (rate only patient's report): No Awareness, Dental Status Current problems with teeth and/or dentures?: No Does patient usually wear dentures?: No  CIWA:  CIWA-Ar Total: 3 COWS:      Treatment Plan Summary: Daily contact with patient to assess and evaluate symptoms and progress in treatment Medication management  Plan: Supportive approach/coping skills/relapse prevention. Currently on 1:1 supervision for safety/assist with adls. Encouraged out of room, participation in group sessions and application of coping skills when distressed. Will continue to monitor response to/adverse effects of medications in use to assure effectiveness. Continue to monitor mood, behavior and interaction with staff and other patients. Continue current plan of care.  Medical Decision Making Problem  Points:  Established problem, stable/improving (1), Review of last therapy session (1) and Review of psycho-social stressors (1) Data Points:  Review of medication regiment & side effects (2)  I certify that inpatient services furnished can reasonably be expected to improve the patient's condition.   Armandina Stammer I 02/06/2013, 12:27 PM

## 2013-02-06 NOTE — Progress Notes (Signed)
D   Pt continues to need constant redirection and prompting for the simplist task   He needs prompting to go to the bathroom and how to do it A   On 1:1  R   Safe at present

## 2013-02-06 NOTE — Progress Notes (Signed)
Patient ID: COLT MARTELLE, male   DOB: [birth year], 52 y.o.   MRN: [id number] Psychoeducational Group Note  Date:  02/06/2013 Time:  0930am  Group Topic/Focus:  Making Healthy Choices:   The focus of this group is to help patients identify negative/unhealthy choices they were using prior to admission and identify positive/healthier coping strategies to replace them upon discharge.  Participation Level:  Did Not Attend  Participation Quality:    Affect:   Cognitive:  Insight:  Engagement in Group:  Additional Comments:  Inventory group- pt is on a 1:1  Valente David 02/06/2013,10:05 AM

## 2013-02-06 NOTE — Progress Notes (Signed)
Psychoeducational Group Note  Date:  02/06/2013 Time:  0945 am  Group Topic/Focus:  Making Healthy Choices:   The focus of this group is to help patients identify negative/unhealthy choices they were using prior to admission and identify positive/healthier coping strategies to replace them upon discharge.  Participation Level:  Did not attend   Andrena Mews 02/06/2013, 10:29 AM

## 2013-02-06 NOTE — Progress Notes (Signed)
Pt placed on 1:1 @2210  due to need for constant redirection/supervision of ADLs following elective incontinence and noncooperation with staff attempts to clean Pt.  Pt is cognitively limited; required cues to take medications correctly.  In bed throughout shift except when assisted by staff to toilet; Pt is lethargic, affect blunted, blank facial expression, and only brief eye contact.  Speech is coherent but minimal; interaction is childlike and minimal.  Thinking is concrete, judgment poor, with mild to moderate confusion.  Miralax and Senecot held until [facility] solidify.  Denies SI/HI, AVH, and pain.  Contracting for safety on unit but level of understanding is unclear.  "No Roommate" order obtained.  PRNs: Trazodone 50mg  PO 2325, Trazodone 50mg  PO 0110, Haldol 0.5mg  PO 0158.  Took HS meds without issue.   Dion Saucier RN

## 2013-02-06 NOTE — Clinical Social Work Note (Signed)
[facility] Group Notes: (Clinical Social Work)   02/06/2013      Type of Therapy:  Group Therapy   Participation Level:  Did Not Attend    Ambrose Mantle, LCSW 02/06/2013, 12:48 PM

## 2013-02-07 MED ORDER — HYDROXYZINE HCL 50 MG PO TABS
50.0000 mg | ORAL_TABLET | Freq: Every evening | ORAL | Status: DC | PRN
  Administered 2013-02-07: 50 mg via ORAL

## 2013-02-07 NOTE — Progress Notes (Signed)
Nursing 1:1 note- Patient is resting quietly. No change from previous assessment. Continue 1:1 for safety. Patient remains safe.

## 2013-02-07 NOTE — Progress Notes (Signed)
Nursing 1:1 note- Patient stays isolative to room. Prompted to use the toilet, then Todd Fitzgerald was confused and unwilling/unable to pull up his depends without assistance. Minimal evening meal eaten. Taking adequate fluids.  No incontinence.  Compliant with scheduled medications.  Continue 1:1 for safety. Remains safe.

## 2013-02-07 NOTE — Progress Notes (Signed)
Pt remains on 1:1 constant observation.  Pt complaining of burning upon urination and continuing difficulty with sleep.  Sticky notes on both issues left for MD. Order for Vistaril 50mg  PO PRN for insomnia obtained from Dr. Dub Mikes. 1:1 notes filed with sitter notes on clipboard.  Pt remains safe at this time, in no acute distress. PRNs: Trazodone 50mg  PO 2109; Trazodone 50mg  PO 2230; Haldol 0.5mg  PO 2230; Haldol 0.5mg  PO 0151; Vistaril 50mg  PO 0210.  Dion Saucier RN

## 2013-02-07 NOTE — Treatment Plan (Signed)
Interdisciplinary Treatment Plan Update (Adult)  Date: 02/07/2013  Time Reviewed: 8:06 AM   Progress in Treatment: Attending groups: No Participating in groups: No Taking medication as prescribed: Yes Tolerating medication: Yes   Family/Significant other contact made:  No Patient understands diagnosis:  Yes  As evidenced by asking for help with depression, and getting back to his group home Discussing patient identified problems/goals with staff:  Yes  See below Medical problems stabilized or resolved:  Yes Denies suicidal/homicidal ideation: Yes  In tx team Issues/concerns per patient self-inventory:  Not filled out Other:  New problem(s) identified: N/A  Reason for Continuation of Hospitalization: Depression Medication stabilization  Interventions implemented related to continuation of hospitalization:  Celexa trial with haldol prn for agitation  Encourage group participation  Today Gen is unable to participate in milieu as he was given medication to loosen his bowels  Additional comments:  Estimated length of stay: 3-4 days  Discharge Plan: see below  New goal(s): N/A  Review of initial/current patient goals per problem list:   1.  Goal(s):Eliminate SI  Met:  Yes  Target date:2/10  As evidenced [id number] report  2.  Goal (s): Stabilize mood with the use of medication, therapeutic milieu  Met:  No  Target date:2/14  As evidenced [id number] will present with a brighter affect, and will rate his depression at 4 or less on a 10 scale  3.  Goal(s): Identify dispositional, follow up plan  Met:  No  Target date:2/14  As evidenced FA:OZHY follow up with pt to find out the name of his group home, and make sure he can return there, as well as verifying with whom he follows up for outpt services.  4.  Goal(s):  Met:  No  Target date:  As evidenced by:  Attendees: Patient:     Family:     Physician:  Thedore Mins 02/07/2013 8:06 AM   Nursing:  [place] Cellar 02/07/2013 8:06 AM   Clinical Social Worker:  Richelle Ito 02/07/2013 8:06 AM   Extender:  Verne Spurr PA 02/07/2013 8:06 AM   Other:     Other:     Other:     Other:      Scribe for Treatment Team:   [name], 02/07/2013 8:06 AM

## 2013-02-07 NOTE — Progress Notes (Signed)
Nursing 1:1 note- Patient is isolative to room. Is oriented x3.  Denies SI/HI.  Meal eaten ad fluids taken.  No observations of responding to internal stimuli.  Continuing assistance with toileting and no episodes of incontinence.  Continue 1:1 for safety.  Remains safe.

## 2013-02-07 NOTE — Clinical Social Work Note (Signed)
  Type of Therapy: Process Group Therapy  Participation Level:  Did Not Attend    Summary of Progress/Problems: Today's group addressed the issue of overcoming obstacles.  Patients were asked to identify their biggest obstacle post d/c that stands in the way of their on-going success, and then problem solve as to how to manage this.       [name] 02/07/2013   2:11 PM

## 2013-02-07 NOTE — Progress Notes (Signed)
Psychoeducational Group Note  Date:  02/07/2013 Time: 2005 Group Topic/Focus:  Wrap-Up Group:   The focus of this group is to help patients review their daily goal of treatment and discuss progress on daily workbooks.  Participation Level: Did Not Attend  Participation Quality:  Not Applicable  Affect:  Not Applicable  Cognitive:  Not Applicable  Insight:  Not Applicable  Engagement in Group: Not Applicable  Additional Comments:  Pt. Was waken up for group but refused to attend  Gwenevere Ghazi Patience 02/07/2013, 9:53 PM

## 2013-02-07 NOTE — Progress Notes (Signed)
[facility] LCSW Aftercare Discharge Planning Group Note  02/07/2013 9:31 AM  Participation Quality:  Did not attend    Todd Fitzgerald 02/07/2013, 9:31 AM

## 2013-02-07 NOTE — Progress Notes (Signed)
Recreation Therapy Notes  02/07/2013         Time: 9:30am       Group Topic/Focus: Exercise Education/Wellness   Participation Level: Did not attend    Jearl Klinefelter, LRT/CTRS 02/07/2013 12:00 PM

## 2013-02-07 NOTE — Progress Notes (Signed)
Patient ID: Todd Fitzgerald, male   DOB: [birth year], 52 y.o.   MRN: [id number] [facility] MD Progress Note  02/07/2013 1:12 PM SANDIP POWER  MRN:  [id number]  Subjective: Todd Fitzgerald is on 1:1 supervision for safety and assist with ADLs. Todd Fitzgerald is able to tell me he feels fine today, but is unable to tell indicate his level of orientation. He does not know, date, month or year. He can not identify what type of building he is in.  He is able to tell me that he is in the hospital because he drank antifreeze, and that he went to the store and bought it himself.  Todd Fitzgerald states that he is from [place], but lives in [place] in a group home. Diagnosis:   Axis I: Major depressive disorder, recurrent episode Axis II: Mental retardation, severity unknown Axis III:  Past Medical History  Diagnosis Date  . 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 CKD (chronic kidney disease)     questionable  . Heart murmur   . MDD (major depressive disorder)   . ED (erectile dysfunction)   . BPH (benign prostatic hyperplasia)   . PTSD (post-traumatic stress disorder)   . HLD (hyperlipidemia)   . Borderline personality disorder   . Schizoaffective disorder   . Ruptured disc, cervical    Axis IV: other psychosocial or environmental problems Axis V: 41-50 serious symptoms  ADL's:  Impaired  Sleep: Poor  Appetite:  Good  Suicidal Ideation:  Denies Homicidal Ideation:  Denies  AEB (as evidenced by): per patient's report.  Psychiatric Specialty Exam: Review of Systems  Constitutional: Negative.   HENT: Negative.   Eyes: Negative.   Cardiovascular: Negative.   Gastrointestinal: Positive for diarrhea.  Genitourinary: Negative.   Musculoskeletal: Negative.   Neurological: Negative.   Endo/Heme/Allergies: Negative.   Psychiatric/Behavioral: Positive for depression and substance abuse. Negative for suicidal ideas, hallucinations and memory loss. The patient is not  nervous/anxious and does not have insomnia.     Blood pressure 130/73, pulse 80, temperature 97.6 F (36.4 C), temperature source Oral, resp. rate 17, height 5\' 8"  (1.727 m), weight 91.627 kg (202 lb).Body mass index is 30.72 kg/(m^2).  General Appearance: Disheveled  Eye Contact::  Good  Speech:  Clear and Coherent  Volume:  Decreased  Mood:  depressed  Affect:  Flat  Thought Process:  Coherent  Orientation:  0/3  Thought Content:  Rumination  Suicidal Thoughts:  No  Homicidal Thoughts:  No  Memory:  Immediate;   Fair Recent;   Fair Remote;   Poor  Judgement:  Impaired  Insight:  Shallow  Psychomotor Activity:  Calm  Concentration:  Fair  Recall:  Fair  Akathisia:  No  Handed:  Right  AIMS (if indicated):     Assets:  Desire for Improvement  Sleep:  Number of Hours: 5.5   Current Medications: Current Facility-Administered Medications  Medication Dose Route Frequency Provider Last Rate Last Dose  . acetaminophen (TYLENOL) tablet 650 mg  650 mg Oral Q6H PRN Larena Sox, MD      . acetaminophen (TYLENOL) tablet 650 mg  650 mg Oral Q6H PRN Shuvon Rankin, NP      . alum & mag hydroxide-simeth (MAALOX/MYLANTA) 200-200-20 MG/5ML suspension 30 mL  30 mL Oral Q4H PRN Larena Sox, MD      . alum & mag hydroxide-simeth (MAALOX/MYLANTA) 200-200-20 MG/5ML suspension 30 mL  30  mL Oral Q4H PRN Shuvon Rankin, NP      . aspirin EC tablet 81 mg  81 mg Oral QHS Sanjuana Kava, NP   81 mg at 02/06/13 2108  . cholecalciferol (VITAMIN D) tablet 800 Units  800 Units Oral QHS Sanjuana Kava, NP   800 Units at 02/06/13 2108  . citalopram (CELEXA) tablet 40 mg  40 mg Oral Daily Shuvon Rankin, NP   40 mg at 02/07/13 0817  . gemfibrozil (LOPID) tablet 600 mg  600 mg Oral BID Sanjuana Kava, NP   600 mg at 02/07/13 1610  . haloperidol (HALDOL) tablet 0.5 mg  0.5 mg Oral BID PRN Sanjuana Kava, NP   0.5 mg at 02/07/13 0151  . hydrOXYzine (ATARAX/VISTARIL) tablet 50 mg  50 mg Oral QHS PRN Rachael Fee, MD   50 mg at 02/07/13 0210  . magnesium hydroxide (MILK OF MAGNESIA) suspension 30 mL  30 mL Oral Daily PRN Shuvon Rankin, NP      . omega-3 acid ethyl esters (LOVAZA) capsule 1 g  1 g Oral QHS Sanjuana Kava, NP   1 g at 02/06/13 2108  . pantoprazole (PROTONIX) EC tablet 20 mg  20 mg Oral Daily Larena Sox, MD   20 mg at 02/07/13 0817  . pyridOXINE (VITAMIN B-6) tablet 50 mg  50 mg Oral Daily Sanjuana Kava, NP   50 mg at 02/07/13 0817  . simvastatin (ZOCOR) tablet 20 mg  20 mg Oral Daily Sanjuana Kava, NP   20 mg at 02/07/13 0800  . Tamsulosin HCl (FLOMAX) capsule 0.4 mg  0.4 mg Oral Daily Sanjuana Kava, NP   0.4 mg at 02/07/13 0800  . traZODone (DESYREL) tablet 50 mg  50 mg Oral QHS PRN,MR X 1 Larena Sox, MD   50 mg at 02/06/13 2231    Lab Results:  No results found for this or any previous visit (from the past 48 hour(s)).  Physical Findings: AIMS: Facial and Oral Movements Muscles of Facial Expression: None, normal Lips and Perioral Area: None, normal Jaw: None, normal Tongue: None, normal,Extremity Movements Upper (arms, wrists, hands, fingers): None, normal Lower (legs, knees, ankles, toes): None, normal, Trunk Movements Neck, shoulders, hips: None, normal, Overall Severity Severity of abnormal movements (highest score from questions above): None, normal Incapacitation due to abnormal movements: None, normal Patient's awareness of abnormal movements (rate only patient's report): No Awareness, Dental Status Current problems with teeth and/or dentures?: No Does patient usually wear dentures?: No  CIWA:  CIWA-Ar Total: 3 COWS:     Treatment Plan Summary: Daily contact with patient to assess and evaluate symptoms and progress in treatment Medication management  Plan: Supportive approach/coping skills/relapse prevention. Currently on 1:1 supervision for safety/assist with adls. Encouraged out of room, participation in group sessions and application of coping  skills when distressed. Will continue to monitor bowel habits to ensure bowels are moving but will d/c laxitives and senakot at this time. Continue to monitor mood, behavior and interaction with staff and other patients. Continue current plan of care.  Medical Decision Making Problem Points:  Established problem, stable/improving (1), Review of last therapy session (1) and Review of psycho-social stressors (1) Data Points:  Review of medication regiment & side effects (2)  I certify that inpatient services furnished can reasonably be expected to improve the patient's condition.    Todd Fitzgerald. Vivika Poythress RPAC 02/07/2013, 1:12 PM

## 2013-02-08 DIAGNOSIS — Z9119 Patient's noncompliance with other medical treatment and regimen: Secondary | ICD-10-CM

## 2013-02-08 LAB — CBC WITH DIFFERENTIAL/PLATELET
Eosinophils Absolute: 0.4 10*3/uL (ref 0.0–0.7)
Eosinophils Relative: 5 % (ref 0–5)
HCT: 36.2 % — ABNORMAL LOW (ref 39.0–52.0)
Hemoglobin: 12 g/dL — ABNORMAL LOW (ref 13.0–17.0)
Lymphocytes Relative: 19 % (ref 12–46)
Lymphs Abs: 1.6 10*3/uL (ref 0.7–4.0)
MCH: 30.2 pg (ref 26.0–34.0)
MCV: 91.2 fL (ref 78.0–100.0)
Monocytes Absolute: 0.7 10*3/uL (ref 0.1–1.0)
Monocytes Relative: 8 % (ref 3–12)
Platelets: 368 10*3/uL (ref 150–400)
RBC: 3.97 MIL/uL — ABNORMAL LOW (ref 4.22–5.81)
WBC: 8.3 10*3/uL (ref 4.0–10.5)

## 2013-02-08 MED ORDER — CLOZAPINE 25 MG PO TABS
25.0000 mg | ORAL_TABLET | ORAL | Status: DC
  Administered 2013-02-08 – 2013-02-09 (×3): 25 mg via ORAL
  Filled 2013-02-08 (×5): qty 1

## 2013-02-08 NOTE — Progress Notes (Signed)
Patient ID: Todd Fitzgerald, male   DOB: Apr 11, 1961, 52 y.o.   MRN: [id number] [facility] MD Progress Note  02/08/2013 2:38 PM KEISHON CHAVARIN  MRN:  [id number]  Subjective: "I'm doing pretty good." Met with Xiong 1:1 today to discuss his progress. He states he is not suicidal, rates his depression at a 4. Sitter notes that he is toileting well by himself.  Chart review notes that he was previously on Clozaril at the group home, but it is unclear when he received his last dose. It was not restarted when he was initially hospitalized, for the same reason.  Diagnosis:   Axis I: Major depressive disorder, recurrent episode Axis II: Mental retardation, severity unknown Axis III:  Past Medical History  Diagnosis Date  . 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 CKD (chronic kidney disease)     questionable  . Heart murmur   . MDD (major depressive disorder)   . ED (erectile dysfunction)   . BPH (benign prostatic hyperplasia)   . PTSD (post-traumatic stress disorder)   . HLD (hyperlipidemia)   . Borderline personality disorder   . Schizoaffective disorder   . Ruptured disc, cervical    Axis IV: other psychosocial or environmental problems Axis V: 41-50 serious symptoms  ADL's:  Impaired  Sleep: "pretty good"  Appetite:  Good  Suicidal Ideation:  Denies Homicidal Ideation:  Denies  AEB (as evidenced by): per patient's report.  Psychiatric Specialty Exam: Review of Systems  Constitutional: Negative.   HENT: Negative.   Eyes: Negative.   Cardiovascular: Negative.   Gastrointestinal: Positive for diarrhea.  Genitourinary: Negative.   Musculoskeletal: Negative.   Neurological: Negative.   Endo/Heme/Allergies: Negative.   Psychiatric/Behavioral: Positive for depression and substance abuse. Negative for suicidal ideas, hallucinations and memory loss. The patient is not nervous/anxious and does not have insomnia.     Blood pressure 145/78,  pulse 82, temperature 98.4 F (36.9 C), temperature source Oral, resp. rate 20, height 5\' 8"  (1.727 m), weight 91.627 kg (202 lb).Body mass index is 30.72 kg/(m^2).  General Appearance: Disheveled  Eye Contact::  Good  Speech:  Clear and Coherent  Volume:  Decreased  Mood:  depressed  Affect:  Flat  Thought Process:  Coherent  Orientation:  0/3  Thought Content:  Rumination  Suicidal Thoughts:  No  Homicidal Thoughts:  No  Memory:  Immediate;   Fair Recent;   Fair Remote;   Poor  Judgement:  Impaired  Insight:  Shallow  Psychomotor Activity:  Calm  Concentration:  Fair  Recall:  Fair  Akathisia:  No  Handed:  Right  AIMS (if indicated):     Assets:  Desire for Improvement  Sleep:  Number of Hours: 6.75   Current Medications: Current Facility-Administered Medications  Medication Dose Route Frequency Provider Last Rate Last Dose  . acetaminophen (TYLENOL) tablet 650 mg  650 mg Oral Q6H PRN Larena Sox, MD      . acetaminophen (TYLENOL) tablet 650 mg  650 mg Oral Q6H PRN Shuvon Rankin, NP      . alum & mag hydroxide-simeth (MAALOX/MYLANTA) 200-200-20 MG/5ML suspension 30 mL  30 mL Oral Q4H PRN Larena Sox, MD      . alum & mag hydroxide-simeth (MAALOX/MYLANTA) 200-200-20 MG/5ML suspension 30 mL  30 mL Oral Q4H PRN Shuvon Rankin, NP      . aspirin EC tablet 81 mg  81 mg Oral QHS  Sanjuana Kava, NP   81 mg at 02/07/13 2157  . cholecalciferol (VITAMIN D) tablet 800 Units  800 Units Oral QHS Sanjuana Kava, NP   800 Units at 02/07/13 2157  . citalopram (CELEXA) tablet 40 mg  40 mg Oral Daily Shuvon Rankin, NP   40 mg at 02/08/13 0818  . cloZAPine (CLOZARIL) tablet 25 mg  25 mg Oral BID Verne Spurr, PA-C      . gemfibrozil (LOPID) tablet 600 mg  600 mg Oral BID Sanjuana Kava, NP   600 mg at 02/08/13 0800  . haloperidol (HALDOL) tablet 0.5 mg  0.5 mg Oral BID PRN Sanjuana Kava, NP   0.5 mg at 02/07/13 0151  . hydrOXYzine (ATARAX/VISTARIL) tablet 50 mg  50 mg Oral QHS PRN  Rachael Fee, MD   50 mg at 02/07/13 0210  . magnesium hydroxide (MILK OF MAGNESIA) suspension 30 mL  30 mL Oral Daily PRN Shuvon Rankin, NP      . omega-3 acid ethyl esters (LOVAZA) capsule 1 g  1 g Oral QHS Sanjuana Kava, NP   1 g at 02/07/13 2157  . pantoprazole (PROTONIX) EC tablet 20 mg  20 mg Oral Daily Larena Sox, MD   20 mg at 02/08/13 0819  . pyridOXINE (VITAMIN B-6) tablet 50 mg  50 mg Oral Daily Sanjuana Kava, NP   50 mg at 02/08/13 0819  . simvastatin (ZOCOR) tablet 20 mg  20 mg Oral Daily Sanjuana Kava, NP   20 mg at 02/08/13 0819  . Tamsulosin HCl (FLOMAX) capsule 0.4 mg  0.4 mg Oral Daily Sanjuana Kava, NP   0.4 mg at 02/08/13 0818  . traZODone (DESYREL) tablet 50 mg  50 mg Oral QHS PRN,MR X 1 Larena Sox, MD   50 mg at 02/07/13 2200    Lab Results:  No results found for this or any previous visit (from the past 48 hour(s)).  Physical Findings: AIMS: Facial and Oral Movements Muscles of Facial Expression: None, normal Lips and Perioral Area: None, normal Jaw: None, normal Tongue: None, normal,Extremity Movements Upper (arms, wrists, hands, fingers): None, normal Lower (legs, knees, ankles, toes): None, normal, Trunk Movements Neck, shoulders, hips: None, normal, Overall Severity Severity of abnormal movements (highest score from questions above): None, normal Incapacitation due to abnormal movements: None, normal Patient's awareness of abnormal movements (rate only patient's report): No Awareness, Dental Status Current problems with teeth and/or dentures?: No Does patient usually wear dentures?: No  CIWA:  CIWA-Ar Total: 3 COWS:     Treatment Plan Summary: Daily contact with patient to assess and evaluate symptoms and progress in treatment Medication management  Plan: Supportive approach/coping skills/relapse prevention. Currently on 1:1 supervision for safety/assist with adls. Encouraged out of room, participation in group sessions and application of  coping skills when distressed. Will continue to monitor bowel habits to ensure bowels are moving but will d/c laxitives and senakot at this time. Continue to monitor mood, behavior and interaction with staff and other patients. Continue current plan of care. 1. Clozaril has been verified and will be restarted today as written and increased daily to return him to his previous dose. ( to decrease his suicidal thoughts) 2. His 1:1 will be discontinued as he is making good progress and is able to toilet on his own. 3. CBC and diff is ordered for restart of Clozaril. Medical Decision Making Problem Points:  Established problem, stable/improving (1), Review of last therapy  session (1) and Review of psycho-social stressors (1) Data Points:  Review or order medicine tests (1) Review and summation of old records (2) Review of medication regiment & side effects (2)  I certify that inpatient services furnished can reasonably be expected to improve the patient's condition.    Rona Ravens. Hjalmar Ballengee RPAC 02/08/2013, 2:38 PM

## 2013-02-08 NOTE — Progress Notes (Signed)
Patient ID: Todd Fitzgerald, male   DOB: 1961/08/24, 52 y.o.   MRN: [id number] 1:1 observation note: patient remains 1:1 due needs assistance with ADLs.  He is however, toileting and doing his ADLs with reminders at this time.  He is lying in bed in his hospital gown exhibiting lethargy, no motivation to get up this morning.  He is attending no groups or participating in his treatment.  Patient is communicative with staff; isolative to room, oriented to person and place.  He has to be reminded to toilet.    Continue to assess patient and redirect as needed.  Patient is exhibiting appropriate behavior at this time.  Encourage and support patient.

## 2013-02-08 NOTE — Progress Notes (Addendum)
[facility] Post 1:1 Observation Documentation  For the first (8) hours following discontinuation of 1:1 precautions, a progress note entry by nursing staff should be documented at least every 2 hours, reflecting the patient's behavior, condition, mood, and conversation.  Use the progress notes for additional entries.  Time 1:1 discontinued:  1400     Patient's Behavior:  Cooperative.  He is lying in bed resting quietly.      Patient's Condition:  Patient's physical condition is stable.  His mood is neutral and stable.   Patient's Conversation:  Asked patient is he needs anything, he replied, "no."  Asked patient how he is doing, he replied, "I am doing fine."    Todd Fitzgerald 02/08/2013, 5:02 PM

## 2013-02-08 NOTE — Progress Notes (Signed)
Psychoeducational Group Note  Date:  02/08/2013 Time:  2000  Group Topic/Focus:  Wrap-Up Group:   The focus of this group is to help patients review their daily goal of treatment and discuss progress on daily workbooks.  Participation Level: Did Not Attend  Participation Quality:  Not Applicable  Affect:  Not Applicable  Cognitive:  Not Applicable  Insight:  Not Applicable  Engagement in Group: Not Applicable  Additional Comments:  Pt was invited to group, but responded with several "Huh?"'s and remained standing in his doorway.  Christ Kick 02/08/2013, 8:50 PM

## 2013-02-08 NOTE — Progress Notes (Signed)
[facility] LCSW Aftercare Discharge Planning Group Note  02/08/2013 12:24 PM  Participation Quality:  Did not attend    Ida Rogue 02/08/2013, 12:24 PM

## 2013-02-08 NOTE — Progress Notes (Signed)
[facility] Post 1:1 Observation Documentation  For the first (8) hours following discontinuation of 1:1 precautions, a progress note entry by nursing staff should be documented at least every 2 hours, reflecting the patient's behavior, condition, mood, and conversation.  Use the progress notes for additional entries.  Time 1:1 discontinued: 1400    Patient's Behavior:  Calm and cooperative. He is lying in the bed resting quietly.     Patient's Condition: patient's physical condition is stable.   Patient's Conversation:  Patient states "I am fine."   Cranford Mon 02/08/2013, 5:09 PM

## 2013-02-08 NOTE — Progress Notes (Signed)
Patient ID: Todd Fitzgerald, male   DOB: 1961/02/02, 52 y.o.   MRN: [id number] 1:1 observation note:  1:1 observation discontinued.  Patient is able to toilet by himself.  He does have to be prompted to go to the bathroom.  He does not exhibit any behavioral issues at this time.  He denies any SI/HI/AVH.  Patient will also be put back on clozaril.  Continue to assess and maintain safety.

## 2013-02-08 NOTE — Clinical Social Work Note (Addendum)
Spoke with staff member at [facility] [phone] indicated patient has been there for about a year, that he drank anti freeze when he went on his monthly home visit, and that the lack of structure in that environment is probably what led to his choice to ingest poison.  They plan on him returning there, and she gave me the number for the manager, Ms [phone].  Called-unable to leave message as mailbox is full.   Spoke to Ms Ladona Ridgel who is willing to come see pt on Fri PM and transport home if stable.  She informed me that he is on Clozapine 200Qd and 300HS, and that med and dosage are essential for his mental health well being.

## 2013-02-09 MED ORDER — TRAZODONE HCL 100 MG PO TABS
100.0000 mg | ORAL_TABLET | Freq: Every evening | ORAL | Status: DC | PRN
  Administered 2013-02-09: 100 mg via ORAL
  Filled 2013-02-09: qty 1

## 2013-02-09 MED ORDER — HALOPERIDOL 5 MG PO TABS
5.0000 mg | ORAL_TABLET | Freq: Every day | ORAL | Status: DC
  Administered 2013-02-10 – 2013-02-11 (×2): 5 mg via ORAL
  Filled 2013-02-09: qty 1
  Filled 2013-02-09: qty 3
  Filled 2013-02-09 (×3): qty 1

## 2013-02-09 MED ORDER — LORAZEPAM 0.5 MG PO TABS
0.5000 mg | ORAL_TABLET | Freq: Two times a day (BID) | ORAL | Status: DC
  Administered 2013-02-09 – 2013-02-11 (×4): 0.5 mg via ORAL
  Filled 2013-02-09 (×2): qty 1
  Filled 2013-02-09: qty 6
  Filled 2013-02-09 (×2): qty 1

## 2013-02-09 MED ORDER — CLOZAPINE 25 MG PO TABS
50.0000 mg | ORAL_TABLET | Freq: Two times a day (BID) | ORAL | Status: DC
  Administered 2013-02-09 – 2013-02-10 (×2): 50 mg via ORAL
  Filled 2013-02-09 (×6): qty 2

## 2013-02-09 MED ORDER — BENZTROPINE MESYLATE 1 MG PO TABS
1.0000 mg | ORAL_TABLET | Freq: Two times a day (BID) | ORAL | Status: DC
  Administered 2013-02-09 – 2013-02-11 (×4): 1 mg via ORAL
  Filled 2013-02-09 (×6): qty 1
  Filled 2013-02-09: qty 6
  Filled 2013-02-09 (×2): qty 1
  Filled 2013-02-09: qty 6

## 2013-02-09 NOTE — Progress Notes (Signed)
Adult Psychoeducational Group Note  Date:  02/09/2013 Time:  2010  Group Topic/Focus:  Wrap-Up Group:   The focus of this group is to help patients review their daily goal of treatment and discuss progress on daily workbooks.  Participation Level:  None  Participation Quality:  None  Affect:  None  Cognitive:  Lacking  Insight: None  Engagement in Group:  none  Modes of Intervention:  Discussion and Support  Additional Comments:  Pt. attended group but refused to share in group  Gwenevere Ghazi Patience 02/09/2013, 9:24 PM

## 2013-02-09 NOTE — Progress Notes (Signed)
[facility] Post 1:1 Observation Documentation  For the first (8) hours following discontinuation of 1:1 precautions, a progress note entry by nursing staff should be documented at least every 2 hours, reflecting the patient's behavior, condition, mood, and conversation.  Use the progress notes for additional entries.  Time 1:1 discontinued:  1400  Patient's Behavior:  Pt was pleasant but was reluctant to eat his dinner. Pt had to be directed several times to taste his food. Pt replied several times "I don't want it". Eventually pt tasted and ate 100% of his dinner.  Patient's Condition:  Pt was still disheveled, but appeared to be more alert.   Patient's Conversation:  Still monosyllabic and wouldn't divulge any info.  Todd Fitzgerald [facility] 02/09/2013, 12:35 AM

## 2013-02-09 NOTE — Clinical Social Work Note (Signed)
[facility] Group Notes:  (Counselor/Nursing/MHT/Case Management/Adjunct)  11/12/2012 12:00 PM  Type of Therapy:  Group Therapy  Participation Level:  Active  Participation Quality:  Appropriate  Affect:  Blunted  Cognitive:  Lacking  Insight:  Lacking  Engagement in Group:  Limited  Engagement in Therapy:  Improving and Limited  Modes of Intervention:  Discussion, socialization, and support  Summary of Progress/Problems: MHA: Patient was attentive, with blunted affect throughout group. He sat quietly throughout speaker's presentation and musical performance. Pt did not ask questions or engage the speaker or other group members during session.  Smart, Heather N 11/12/2012, 12:00 PM

## 2013-02-09 NOTE — Progress Notes (Signed)
Recreation Therapy Notes   02/09/2013  Time: 9:30am   Group Topic/Focus: Time Management   Participation Level:  Minimal   Participation Quality:  Appropriate   Affect:  Flat   Cognitive:  Oriented   Additional Comments: Patient participated in progressive muscle relaxation with group. Patient given "Leisure Time Clock" worksheet. Patient unable to fill out worksheet. LRT asked patient to select coloring pencils to complete worksheet. Patient unable to select coloring pencils. Patient looked at LRT & stated in a firm clear voice "look at me." Patient periodically chuckled to self. Patient had some labored breathing throughout the session. Patient shouted "no" in the middle of the session. LRT or peers were not talking to or about patient at the time. LRT able to engage patient in conversation, patient spoke about his girlfriend and how they have been together for 20 years. Patient spoke about his many pitt bull dogs he has at home. 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 Patient visibly restless. Patient asked if he could go walk around. LRT instructed patient to pace the hallway if he was restless. MHT reviewed rules of pacing the hallway with the patient. Patient appeared to respond to internal stimuli throughout group session.   Marykay Lex Illeana Edick, LRT/CTRS    Ivett Luebbe L 02/09/2013 10:16 AM

## 2013-02-09 NOTE — Progress Notes (Signed)
[facility] LCSW Aftercare Discharge Planning Group Note  02/09/2013 11:30 AM  Participation Quality:  Attentive  Affect:  Blunted  Cognitive:  Oriented  Insight:  Limited  Engagement in Group:  Limited  Modes of Intervention:  Discussion, Exploration and Socialization  Summary of Progress/Problems: Todd Fitzgerald came to his first group today.  When asked, shared that he stays at home with his family.  I then clarified that I had talked to the folks at the [facility] yesterday about his return there and his medications.  He did not correct me.  Mood appears stable.  Difficult to judge cognition as his responses are very limited.  Todd Fitzgerald 02/09/2013, 11:30 AM

## 2013-02-09 NOTE — Progress Notes (Signed)
Pt denies SI/HI/AVH. Pt is not oriented to time, place and situation. Pt is actively engaged on the milieu, pt attended group and is currently watching t.v in the dayroom. Pt compliant with medication regimen. Pt continues to need redirecting by staff with ADL's.    Medications administered as ordered per MD. Verbal support given. Pt encouraged to attend groups. 15 minute checks performed for safety. Assistance with ADL's.  Pt is cooperative. Pt remains safe.

## 2013-02-09 NOTE — Progress Notes (Signed)
Patient ID: Todd Fitzgerald, male   DOB: 1961-05-09, 52 y.o.   MRN: [id number] [facility] MD Progress Note  02/09/2013 10:55 AM Todd Fitzgerald  MRN:  [id number]  Subjective: Patient reports that he is doing better today, feeling less depressed with occasional suicidal thoughts. Patient has been restarted on Clozapine and Haldol as per Group home report.  Diagnosis:   Axis I: Major depressive disorder, recurrent episode Axis II: Mental retardation, severity unknown Axis III:  Past Medical History  Diagnosis Date  . Suicidal ideation     overdose drugs age 89, last 7 years antifreeze ingestion x 4 times  . CKD (chronic kidney disease)     questionable  . Heart murmur   . MDD (major depressive disorder)   . ED (erectile dysfunction)   . BPH (benign prostatic hyperplasia)   . PTSD (post-traumatic stress disorder)   . HLD (hyperlipidemia)   . Borderline personality disorder   . Schizoaffective disorder   . Ruptured disc, cervical    Axis IV: other psychosocial or environmental problems Axis V: 41-50 serious symptoms  ADL's:  Impaired  Sleep: "pretty good"  Appetite:  Good  Suicidal Ideation:  Denies Homicidal Ideation:  Denies  AEB (as evidenced by): per patient's report.  Psychiatric Specialty Exam: Review of Systems  Constitutional: Negative.   HENT: Negative.   Eyes: Negative.   Cardiovascular: Negative.   Gastrointestinal: Positive for diarrhea.  Genitourinary: Negative.   Musculoskeletal: Negative.   Neurological: Negative.   Endo/Heme/Allergies: Negative.   Psychiatric/Behavioral: Positive for depression and substance abuse. Negative for suicidal ideas, hallucinations and memory loss. The patient is not nervous/anxious and does not have insomnia.     Blood pressure 124/84, pulse 87, temperature 98.2 F (36.8 C), temperature source Oral, resp. rate 18, height 5\' 8"  (1.727 m), weight 91.627 kg (202 lb).Body mass index is 30.72 kg/(m^2).  General Appearance: Disheveled  Eye  Contact::  Good  Speech:  Clear and Coherent  Volume:  Decreased  Mood:  depressed  Affect:  Flat  Thought Process:  Coherent  Orientation:  0/3  Thought Content:  Rumination  Suicidal Thoughts:  No  Homicidal Thoughts:  No  Memory:  Immediate;   Fair Recent;   Fair Remote;   Poor  Judgement:  Impaired  Insight:  Shallow  Psychomotor Activity:  Calm  Concentration:  Fair  Recall:  Fair  Akathisia:  No  Handed:  Right  AIMS (if indicated):     Assets:  Desire for Improvement  Sleep:  Number of Hours: 6   Current Medications: Current Facility-Administered Medications  Medication Dose Route Frequency Provider Last Rate Last Dose  . acetaminophen (TYLENOL) tablet 650 mg  650 mg Oral Q6H PRN Larena Sox, MD      . acetaminophen (TYLENOL) tablet 650 mg  650 mg Oral Q6H PRN Shuvon Rankin, NP      . alum & mag hydroxide-simeth (MAALOX/MYLANTA) 200-200-20 MG/5ML suspension 30 mL  30 mL Oral Q4H PRN Larena Sox, MD      . alum & mag hydroxide-simeth (MAALOX/MYLANTA) 200-200-20 MG/5ML suspension 30 mL  30 mL Oral Q4H PRN Shuvon Rankin, NP      . aspirin EC tablet 81 mg  81 mg Oral QHS Sanjuana Kava, NP   81 mg at 02/08/13 2220  . benztropine (COGENTIN) tablet 1 mg  1 mg Oral BID Sylvestre Rathgeber      . cholecalciferol (VITAMIN D) tablet 800 Units  800 Units Oral QHS Nicole Kindred  I Nwoko, NP   800 Units at 02/08/13 2220  . citalopram (CELEXA) tablet 40 mg  40 mg Oral Daily Shuvon Rankin, NP   40 mg at 02/09/13 0834  . cloZAPine (CLOZARIL) tablet 50 mg  50 mg Oral BID Janeese Mcgloin      . gemfibrozil (LOPID) tablet 600 mg  600 mg Oral BID Sanjuana Kava, NP   600 mg at 02/09/13 0834  . [START ON 02/10/2013] haloperidol (HALDOL) tablet 5 mg  5 mg Oral QPC breakfast Mashal Slavick      . hydrOXYzine (ATARAX/VISTARIL) tablet 50 mg  50 mg Oral QHS PRN Rachael Fee, MD   50 mg at 02/07/13 0210  . LORazepam (ATIVAN) tablet 0.5 mg  0.5 mg Oral BID Viraaj Vorndran      . magnesium hydroxide  (MILK OF MAGNESIA) suspension 30 mL  30 mL Oral Daily PRN Shuvon Rankin, NP      . omega-3 acid ethyl esters (LOVAZA) capsule 1 g  1 g Oral QHS Sanjuana Kava, NP   1 g at 02/08/13 2220  . pantoprazole (PROTONIX) EC tablet 20 mg  20 mg Oral Daily Larena Sox, MD   20 mg at 02/09/13 0834  . pyridOXINE (VITAMIN B-6) tablet 50 mg  50 mg Oral Daily Sanjuana Kava, NP   50 mg at 02/09/13 0834  . simvastatin (ZOCOR) tablet 20 mg  20 mg Oral Daily Sanjuana Kava, NP   20 mg at 02/09/13 0834  . Tamsulosin HCl (FLOMAX) capsule 0.4 mg  0.4 mg Oral Daily Sanjuana Kava, NP   0.4 mg at 02/09/13 0834  . traZODone (DESYREL) tablet 100 mg  100 mg Oral QHS PRN,MR X 1 Jermany Sundell        Lab Results:  Results for orders placed during the hospital encounter of 02/04/13 (from the past 48 hour(s))  CBC WITH DIFFERENTIAL     Status: Abnormal   Collection Time    02/08/13  8:07 PM      Result Value Range   WBC 8.3  4.0 - 10.5 K/uL   RBC 3.97 (*) 4.22 - 5.81 MIL/uL   Hemoglobin 12.0 (*) 13.0 - 17.0 g/dL   HCT 16.1 (*) 09.6 - 04.5 %   MCV 91.2  78.0 - 100.0 fL   MCH 30.2  26.0 - 34.0 pg   MCHC 33.1  30.0 - 36.0 g/dL   RDW 40.9  81.1 - 91.4 %   Platelets 368  150 - 400 K/uL   Neutrophils Relative 67  43 - 77 %   Neutro Abs 5.6  1.7 - 7.7 K/uL   Lymphocytes Relative 19  12 - 46 %   Lymphs Abs 1.6  0.7 - 4.0 K/uL   Monocytes Relative 8  3 - 12 %   Monocytes Absolute 0.7  0.1 - 1.0 K/uL   Eosinophils Relative 5  0 - 5 %   Eosinophils Absolute 0.4  0.0 - 0.7 K/uL   Basophils Relative 1  0 - 1 %   Basophils Absolute 0.0  0.0 - 0.1 K/uL    Physical Findings: AIMS: Facial and Oral Movements Muscles of Facial Expression: None, normal Lips and Perioral Area: None, normal Jaw: None, normal Tongue: None, normal,Extremity Movements Upper (arms, wrists, hands, fingers): None, normal Lower (legs, knees, ankles, toes): None, normal, Trunk Movements Neck, shoulders, hips: None, normal, Overall  Severity Severity of abnormal movements (highest score from questions above): None, normal Incapacitation due  to abnormal movements: None, normal Patient's awareness of abnormal movements (rate only patient's report): No Awareness, Dental Status Current problems with teeth and/or dentures?: Yes (dentures not with the patient) Does patient usually wear dentures?: No  CIWA:  CIWA-Ar Total: 3 COWS:     Treatment Plan Summary: Daily contact with patient to assess and evaluate symptoms and progress in treatment Medication management  Plan: Supportive approach/coping skills/relapse prevention. Encouraged out of room, participation in group sessions and application of coping skills when distressed. Will continue to monitor bowel habits to ensure bowels are moving but will d/c laxitives and senakot at this time. Continue to monitor mood, behavior and interaction with staff and other patients. Continue current plan of care. 1. Star Clozaril 50mg  BID ( to decrease his suicidal thoughts) 2. Patient to continue other medication regimen. 3. CBC and diff is ordered for restart of Clozaril. Medical Decision Making Problem Points:  Established problem, stable/improving (1), Review of last therapy session (1) and Review of psycho-social stressors (1) Data Points:  Review or order medicine tests (1) Review and summation of old records (2) Review of medication regiment & side effects (2)  I certify that inpatient services furnished can reasonably be expected to improve the patient's condition.    Thedore Mins, MD 02/09/2013, 10:55 AM

## 2013-02-09 NOTE — Progress Notes (Signed)
Patient ID: Todd Fitzgerald, male   DOB: Mar 17, 1961, 52 y.o.   MRN: [id number]   D: Pt was in the dayroom during the assessment. Writer asked pt about his day, and he replied with his usual response, "alright". Informed pt that writer was told he'd gone to [employer]. Pt stated, "I don't like going to the cafeteria. I'm sorry." "Why?" "Because it's too many people. I'm sorry".  Writer informed pt that there was no need to apologize. Pt then questioned writer about his night meds and scheduled bed time. Pt was less guarded today than previous days.  A:  Support and encouragement was offered. 15 min checks continued for safety.  R: Pt remains safe.

## 2013-02-10 MED ORDER — CLOZAPINE 100 MG PO TABS
100.0000 mg | ORAL_TABLET | Freq: Two times a day (BID) | ORAL | Status: DC
  Administered 2013-02-10 – 2013-02-11 (×2): 100 mg via ORAL
  Filled 2013-02-10: qty 1
  Filled 2013-02-10: qty 6
  Filled 2013-02-10 (×3): qty 1
  Filled 2013-02-10: qty 6
  Filled 2013-02-10 (×2): qty 1

## 2013-02-10 NOTE — Progress Notes (Signed)
Patient ID: Todd Fitzgerald, male   DOB: 05/20/1961, 52 y.o.   MRN: [id number] Patient has been up in the dayroom watching television with his peers.  He attended group today.  He is responding to staff and has minimal interaction.  He did not go to the cafeteria to eat, preferring to eat on the unit by himself.  He denies any SI/HI/AVH.  He has not needed any redirection to perform his ADLs; he is responding well to his treatment.  Continue to monitor medication management and MD orders.  Safety checks continued every 15 minutes per protocol.    Patient's behavior has been appropriate.

## 2013-02-10 NOTE — Progress Notes (Signed)
Patient ID: Todd Fitzgerald, male   DOB: Mar 28, 1961, 52 y.o.   MRN: [id number] [facility] MD Progress Note  02/10/2013 1:32 PM AUTHOR [place]  MRN:  [id number]  Subjective: Patient reports decreased depressive symptoms, feeling more motivated, increased energy level and sleeping better. He denies suicidal thoughts today. However, he has no insight into his problem due to limited cognition.   Diagnosis:   Axis I: Major depressive disorder, recurrent episode. History of PTSD Axis II: Mental retardation, severity unknown Axis III:  Past Medical History  Diagnosis Date  . CKD (chronic kidney disease)     questionable  . Heart murmur   . ED (erectile dysfunction)   . BPH (benign prostatic hyperplasia)   . HLD (hyperlipidemia)   . Ruptured disc, cervical    Axis IV: other psychosocial or environmental problems Axis V: 41-50 serious symptoms  ADL's:  Impaired  Sleep: "pretty good"  Appetite:  Good  Suicidal Ideation:  Denies Homicidal Ideation:  Denies  AEB (as evidenced by): per patient's report.  Psychiatric Specialty Exam: Review of Systems  Constitutional: Negative.   HENT: Negative.   Eyes: Negative.   Cardiovascular: Negative.   Gastrointestinal: Negative for diarrhea.  Genitourinary: Negative.   Musculoskeletal: Negative.   Neurological: Negative.   Endo/Heme/Allergies: Negative.   Psychiatric/Behavioral: Positive for depression. Negative for suicidal ideas, hallucinations, memory loss and substance abuse. The patient is not nervous/anxious and does not have insomnia.     Blood pressure 139/84, pulse 80, temperature 97.6 F (36.4 C), temperature source Oral, resp. rate 16, height 5\' 8"  (1.727 m), weight 91.627 kg (202 lb).Body mass index is 30.72 kg/(m^2).  General Appearance: Disheveled  Eye Contact::  Good  Speech:  Clear and Coherent  Volume:  Decreased  Mood:  depressed  Affect:  Flat  Thought Process:  Coherent  Orientation:  0/3  Thought Content:  Rumination   Suicidal Thoughts:  No  Homicidal Thoughts:  No  Memory:  Immediate;   Fair Recent;   Fair Remote;   Poor  Judgement:  Impaired  Insight:  Shallow  Psychomotor Activity:  Calm  Concentration:  Fair  Recall:  Fair  Akathisia:  No  Handed:  Right  AIMS (if indicated):     Assets:  Desire for Improvement  Sleep:  Number of Hours: 6.25   Current Medications: Current Facility-Administered Medications  Medication Dose Route Frequency Provider Last Rate Last Dose  . acetaminophen (TYLENOL) tablet 650 mg  650 mg Oral Q6H PRN Larena Sox, MD      . alum & mag hydroxide-simeth (MAALOX/MYLANTA) 200-200-20 MG/5ML suspension 30 mL  30 mL Oral Q4H PRN Larena Sox, MD      . aspirin EC tablet 81 mg  81 mg Oral QHS Sanjuana Kava, NP   81 mg at 02/09/13 2158  . benztropine (COGENTIN) tablet 1 mg  1 mg Oral BID Melbert Botelho   1 mg at 02/10/13 0739  . cholecalciferol (VITAMIN D) tablet 800 Units  800 Units Oral QHS Sanjuana Kava, NP   800 Units at 02/09/13 2159  . citalopram (CELEXA) tablet 40 mg  40 mg Oral Daily Shuvon Rankin, NP   40 mg at 02/10/13 0740  . cloZAPine (CLOZARIL) tablet 100 mg  100 mg Oral BID Nikita Surman      . gemfibrozil (LOPID) tablet 600 mg  600 mg Oral BID Sanjuana Kava, NP   600 mg at 02/10/13 0740  . haloperidol (HALDOL) tablet 5 mg  5 mg Oral QPC breakfast Dijon Cosens   5 mg at 02/10/13 0739  . hydrOXYzine (ATARAX/VISTARIL) tablet 50 mg  50 mg Oral QHS PRN Rachael Fee, MD   50 mg at 02/07/13 0210  . LORazepam (ATIVAN) tablet 0.5 mg  0.5 mg Oral BID Zharia Conrow   0.5 mg at 02/10/13 0739  . magnesium hydroxide (MILK OF MAGNESIA) suspension 30 mL  30 mL Oral Daily PRN Shuvon Rankin, NP      . omega-3 acid ethyl esters (LOVAZA) capsule 1 g  1 g Oral QHS Sanjuana Kava, NP   1 g at 02/09/13 2158  . pantoprazole (PROTONIX) EC tablet 20 mg  20 mg Oral Daily Larena Sox, MD   20 mg at 02/10/13 0739  . pyridOXINE (VITAMIN B-6) tablet 50 mg  50 mg Oral  Daily Sanjuana Kava, NP   50 mg at 02/10/13 0739  . simvastatin (ZOCOR) tablet 20 mg  20 mg Oral Daily Sanjuana Kava, NP   20 mg at 02/10/13 0740  . Tamsulosin HCl (FLOMAX) capsule 0.4 mg  0.4 mg Oral Daily Sanjuana Kava, NP   0.4 mg at 02/10/13 0740  . traZODone (DESYREL) tablet 100 mg  100 mg Oral QHS PRN,MR X 1 Jaizon Deroos   100 mg at 02/09/13 2159    Lab Results:  Results for orders placed during the hospital encounter of 02/04/13 (from the past 48 hour(s))  CBC WITH DIFFERENTIAL     Status: Abnormal   Collection Time    02/08/13  8:07 PM      Result Value Range   WBC 8.3  4.0 - 10.5 K/uL   RBC 3.97 (*) 4.22 - 5.81 MIL/uL   Hemoglobin 12.0 (*) 13.0 - 17.0 g/dL   HCT 78.4 (*) 69.6 - 29.5 %   MCV 91.2  78.0 - 100.0 fL   MCH 30.2  26.0 - 34.0 pg   MCHC 33.1  30.0 - 36.0 g/dL   RDW 28.4  13.2 - 44.0 %   Platelets 368  150 - 400 K/uL   Neutrophils Relative 67  43 - 77 %   Neutro Abs 5.6  1.7 - 7.7 K/uL   Lymphocytes Relative 19  12 - 46 %   Lymphs Abs 1.6  0.7 - 4.0 K/uL   Monocytes Relative 8  3 - 12 %   Monocytes Absolute 0.7  0.1 - 1.0 K/uL   Eosinophils Relative 5  0 - 5 %   Eosinophils Absolute 0.4  0.0 - 0.7 K/uL   Basophils Relative 1  0 - 1 %   Basophils Absolute 0.0  0.0 - 0.1 K/uL    Physical Findings: AIMS: Facial and Oral Movements Muscles of Facial Expression: None, normal Lips and Perioral Area: None, normal Jaw: None, normal Tongue: None, normal,Extremity Movements Upper (arms, wrists, hands, fingers): None, normal Lower (legs, knees, ankles, toes): None, normal, Trunk Movements Neck, shoulders, hips: None, normal, Overall Severity Severity of abnormal movements (highest score from questions above): None, normal Incapacitation due to abnormal movements: None, normal Patient's awareness of abnormal movements (rate only patient's report): No Awareness, Dental Status Current problems with teeth and/or dentures?: Yes (dentures not with the patient) Does  patient usually wear dentures?: No  CIWA:  CIWA-Ar Total: 3 COWS:     Treatment Plan Summary: Daily contact with patient to assess and evaluate symptoms and progress in treatment Medication management  Plan: Supportive approach/coping skills/relapse prevention. Encouraged out of room,  participation in group sessions and application of coping skills when distressed. Will continue to monitor bowel habits to ensure bowels are moving but will d/c laxitives and senakot at this time. Continue to monitor mood, behavior and interaction with staff and other patients. Continue current plan of care. 1. Increase  Clozaril to 100mg  BID ( to decrease his suicidal thoughts) 2. Patient to continue other medication regimen. 3. CBC and diff is ordered to monitor WBC and ANC.  Medical Decision Making Problem Points:  Established problem, stable/improving (1), Review of last therapy session (1) and Review of psycho-social stressors (1) Data Points:  Review or order medicine tests (1) Review and summation of old records (2) Review of medication regiment & side effects (2)  I certify that inpatient services furnished can reasonably be expected to improve the patient's condition.    Thedore Mins, MD 02/10/2013, 1:32 PM

## 2013-02-10 NOTE — Clinical Social Work Note (Signed)
[facility] Group Notes:  (Counselor/Nursing/MHT/Case Management/Adjunct)  11/11/2012 2:20 PM  Type of Therapy:  Group Therapy  Participation Level:  Minimal  Participation Quality:  Attentive  Affect:  Blunted  Cognitive:  Oriented  Insight:  Limited  Engagement in Group:  Engaged  Engagement in Therapy:  Limited  Modes of Intervention:  Discussion, Exploration and Support  Summary of Progress/Problems: .balance: The topic for group was balance in life.  Pt participated in the discussion about when their life was in balance and out of balance and how this feels.  Pt discussed ways to get back in balance and short term goals they can work on to get where they want to be. Todd Fitzgerald identified strong emotions as letting him know he is out of balance.  We got on the subject of families as role models, and Todd Fitzgerald stated that his parents bickered and fought a lot in front of the children, but were eventually able to come to resolution. Todd Fitzgerald has limited insight, and often, in response to a question, seems to give up easily and state "I don't know."   Todd Fitzgerald B 11/11/2012, 2:20 PM

## 2013-02-10 NOTE — Progress Notes (Signed)
[facility] Group Notes:  (Nursing/MHT/Case Management/Adjunct)  Date:  02/10/2013  Time:  4:13 PM  Type of Therapy:  Psychoeducational Skills  Participation Level:  Minimal  Participation Quality:  Attentive  Affect:  Flat  Cognitive:  Oriented  Insight:  Limited  Engagement in Group:  Engaged  Modes of Intervention:  Activity, Discussion, Education, Rapport Building, Socialization and Support  Summary of Progress/Problems: Illias attended group and participated in sharing activity. Ebb stated one goal for today, that a support person is his mother, and that he enjoys watching the snow fall (it is snowing outside during group).  Wandra Scot 02/10/2013, 4:13 PM

## 2013-02-11 DIAGNOSIS — F332 Major depressive disorder, recurrent severe without psychotic features: Principal | ICD-10-CM

## 2013-02-11 LAB — CBC WITH DIFFERENTIAL/PLATELET
Basophils Absolute: 0.1 10*3/uL (ref 0.0–0.1)
Basophils Relative: 1 % (ref 0–1)
Eosinophils Absolute: 0.5 10*3/uL (ref 0.0–0.7)
MCH: 30.5 pg (ref 26.0–34.0)
MCHC: 33.6 g/dL (ref 30.0–36.0)
Neutrophils Relative %: 68 % (ref 43–77)
Platelets: 366 10*3/uL (ref 150–400)

## 2013-02-11 MED ORDER — GEMFIBROZIL 600 MG PO TABS: 600.0000 mg | ORAL_TABLET | Freq: Two times a day (BID) | ORAL | Status: AC

## 2013-02-11 MED ORDER — PANTOPRAZOLE SODIUM 20 MG PO TBEC: 20.0000 mg | DELAYED_RELEASE_TABLET | Freq: Two times a day (BID) | ORAL | Status: AC

## 2013-02-11 MED ORDER — VITAMIN B-6 100 MG PO TABS: 50.0000 mg | ORAL_TABLET | Freq: Every day | ORAL | Status: AC

## 2013-02-11 MED ORDER — TAMSULOSIN HCL 0.4 MG PO CAPS: 0.4000 mg | ORAL_CAPSULE | Freq: Every day | ORAL | Status: AC

## 2013-02-11 MED ORDER — ATROPINE SULFATE 1 % OP SOLN: 2.0000 [drp] | Freq: Every day | OPHTHALMIC | Status: AC

## 2013-02-11 MED ORDER — SIMVASTATIN 20 MG PO TABS: 20.0000 mg | ORAL_TABLET | Freq: Every day | ORAL | Status: AC

## 2013-02-11 MED ORDER — CHOLECALCIFEROL 10 MCG (400 UNIT) PO TABS: 800.0000 [IU] | ORAL_TABLET | Freq: Every day | ORAL | Status: AC

## 2013-02-11 MED ORDER — OMEGA-3 FATTY ACIDS 1000 MG PO CAPS: 1.0000 g | ORAL_CAPSULE | Freq: Every day | ORAL | Status: AC

## 2013-02-11 MED ORDER — LORAZEPAM 0.5 MG PO TABS: 0.5000 mg | ORAL_TABLET | Freq: Two times a day (BID) | ORAL | Status: AC

## 2013-02-11 MED ORDER — SENNOSIDES 8.6 MG PO TABS: 2.0000 | ORAL_TABLET | Freq: Two times a day (BID) | ORAL | Status: AC

## 2013-02-11 MED ORDER — HALOPERIDOL 5 MG PO TABS: 5.0000 mg | ORAL_TABLET | Freq: Every day | ORAL | Status: AC

## 2013-02-11 MED ORDER — TRAZODONE HCL 100 MG PO TABS
200.0000 mg | ORAL_TABLET | Freq: Every day | ORAL | Status: DC
  Filled 2013-02-11 (×2): qty 6

## 2013-02-11 MED ORDER — TRAZODONE HCL 100 MG PO TABS: 200.0000 mg | ORAL_TABLET | Freq: Every day | ORAL | Status: AC

## 2013-02-11 MED ORDER — BENZTROPINE MESYLATE 1 MG PO TABS: 1.0000 mg | ORAL_TABLET | Freq: Two times a day (BID) | ORAL | Status: AC

## 2013-02-11 MED ORDER — CLOZAPINE 100 MG PO TABS: 100.0000 mg | ORAL_TABLET | Freq: Two times a day (BID) | ORAL | Status: AC

## 2013-02-11 MED ORDER — POLYETHYLENE GLYCOL 3350 17 G PO PACK: 17.0000 g | PACK | Freq: Every day | ORAL | Status: AC

## 2013-02-11 MED ORDER — CITALOPRAM HYDROBROMIDE 40 MG PO TABS: 40.0000 mg | ORAL_TABLET | Freq: Every day | ORAL | Status: AC

## 2013-02-11 NOTE — [facility] Suicide Risk Assessment (Signed)
Suicide Risk Assessment  Discharge Assessment     Demographic Factors:  Male, Caucasian, Low socioeconomic status and Unemployed  Mental Status Per Nursing Assessment::   On Admission:     Current Mental Status by Physician: patient denies suicidal ideations, intent or plan  Loss Factors: Decrease in vocational status and Financial problems/change in socioeconomic status  Historical Factors: NA  Risk Reduction Factors:   Positive social support, Positive therapeutic relationship and lives in group home  Continued Clinical Symptoms:  Resolving depressive symptoms  Cognitive Features That Contribute To Risk:  Closed-mindedness Polarized thinking    Suicide Risk:  Minimal: No identifiable suicidal ideation.  Patients presenting with no risk factors but with morbid ruminations; may be classified as minimal risk based on the severity of the depressive symptoms  Discharge Diagnoses:   AXIS I:  Major Depression, Recurrent severe AXIS II:  Borderline IQ AXIS III:   Past Medical History  Diagnosis Date  . 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 CKD (chronic kidney disease)     questionable  . Heart murmur   . ED (erectile dysfunction)   . BPH (benign prostatic hyperplasia)   . HLD (hyperlipidemia)   . Ruptured disc, cervical    AXIS IV:  other psychosocial or environmental problems and problems related to social environment AXIS V:  61-70 mild symptoms  Plan Of Care/Follow-up recommendations:  Activity:  as tolerated Diet:  healthy Tests:  CBC with differential to monitor WBC and AND-patient is on Clozaril Other:  patient to keep his aftercare appointment.  Is patient on multiple antipsychotic therapies at discharge:  No   Has Patient had three or more failed trials of antipsychotic monotherapy by history:  No  Recommended Plan for Multiple Antipsychotic Therapies: Second antipsychotic is Clozapine.  Reason for  adding Clozapine recurrent suicidal thoughts.  Daxen Lanum,MD 02/11/2013, 12:28 PM

## 2013-02-11 NOTE — Progress Notes (Signed)
[facility] Adult Case Management Discharge Plan :  Will you be returning to the same living situation after discharge: Yes,  [facility] At discharge, do you have transportation home?:Yes,  Group Home Do you have the ability to pay for your medications:Yes,  ACT team  Release of information consent forms completed and in the chart;  Patient's signature needed at discharge.  Patient to Follow up at: Follow-up Information   Follow up with [facility] ACT team On 02/14/2013. (Someone from the ACT team will see you on Monday)    Contact information:   [address]  [place]  [phone]       Patient denies SI/HI:   Yes,  yes    Safety Planning and Suicide Prevention discussed:  Yes,  yes  Ida Rogue 02/11/2013, 12:55 PM

## 2013-02-11 NOTE — Progress Notes (Signed)
Patient ID: Todd Fitzgerald, male   DOB: 18-Dec-1961, 52 y.o.   MRN: [id number] Patient has order for d/c and denies SI/HI. He verbalized understanding of discharge instructions and follow up. He received prescriptions and medication samples. The FL2 paperwork was given to the group home representative. Patient pleasant and cooperative during the discharge and his mood appeared to be stable.

## 2013-02-11 NOTE — Progress Notes (Signed)
D: Pt spent time in dayroom during evening shift; remained on edge of peer group but appeared to be listening intently.  Interacted well in 1:1 with higher frustration tolerance evident in responses to assessment probes.  Pt states he still has "racing thoughts" and "I feel like I'm dead- look at me- I'm a zombie.", but was able to maintain fair eye contact.  Facial expression remains flat, affect blunted, and mood anxious.  Speech is logical and coherent (within limits of dental losses).  Pt showed no overt evidence of disorganized thought process or content.  Interaction with peers minimal but improved.  Pt continues to demonstrate impaired judgement and mild confusion but is cooperative with milieu expectations and medication administatration.  Denies SI/HI and AVH; reported severe frontal headache (8/10).  Contracting for safety on unit.  A: Provided verbal support and company for Pt; reinforced progress of last few days.  PRNs: Tylenol 650mg  PO for headache @1954  (follow-up rated at 5/10).  All medications administered according to med orders and POC.  Q15 minute safety checks maintained as per unit protocol.  R: Pt appears significantly improved.  Safety maintained. Dion Saucier RN

## 2013-02-11 NOTE — H&P (Signed)
Agree with assessment and plan Kianni Lheureux A. Annie Saephan, M.D. 

## 2013-02-11 NOTE — Progress Notes (Signed)
Recreation Therapy Notes  02/11/2013         Time: 9:30am      Group Topic/Focus: Exercise  Participation Level: Minimal  Participation Quality: Other:Limited  Affect: Flat  Cognitive: Appropriate   Additional Comments: Patient attended group, but only participated on a minimal level and with encouragement from LRT. Patient sat for the majority of the session, approximately half way through the patient attempted to participate. Patient stated "Don't want to do this" and "Don't know how" LRT instructed patient that he could sit and watch the group rather than participate. Patient remained seated for the rest of the group session.   Marykay Lex Soniyah Mcglory, LRT/CTRS   Sherryn Pollino L 02/11/2013 11:27 AM

## 2013-02-11 NOTE — [facility] Suicide Risk Assessment (Signed)
Suicide Risk Assessment  Discharge Assessment     Demographic Factors:  Male, Low socioeconomic status and Unemployed  Mental Status Per Nursing Assessment::   On Admission:     Current Mental Status by Physician: patient denies suicidal ideations, intent or plan  Loss Factors: Financial problems/change in socioeconomic status  Historical Factors: NA  Risk Reduction Factors:   Positive social support  Continued Clinical Symptoms:  Resolving depressive symptoms  Cognitive Features That Contribute To Risk:  Closed-mindedness Polarized thinking    Suicide Risk:  Minimal: No identifiable suicidal ideation.  Patients presenting with no risk factors but with morbid ruminations; may be classified as minimal risk based on the severity of the depressive symptoms  Discharge Diagnoses:   AXIS I:  Major depressive disorder-recurrent episode  AXIS II:  Borderline IQ AXIS III:   Past Medical History  Diagnosis Date  . 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 CKD (chronic kidney disease)     questionable  . Heart murmur   . ED (erectile dysfunction)   . BPH (benign prostatic hyperplasia)   . HLD (hyperlipidemia)   . Ruptured disc, cervical    AXIS IV:  other psychosocial or environmental problems and problems related to social environment AXIS V:  51-60 moderate symptoms  Plan Of Care/Follow-up recommendations:  Activity:  as tolerated Diet:  healthy  Tests:  complete blood count and differential every month-patient is on Clozapine Other:  patient to keep his after care appointment  Is patient on multiple antipsychotic therapies at discharge:  No   Has Patient had three or more failed trials of antipsychotic monotherapy by history:  No  Recommended Plan for Multiple Antipsychotic Therapies: N/A  Kaelen Caughlin,MD 02/11/2013, 12:11 PM

## 2013-02-11 NOTE — Progress Notes (Signed)
[facility] INPATIENT:  Family/Significant Other Suicide Prevention Education  Suicide Prevention Education:  Patient Discharged to Other Healthcare Facility:  Suicide Prevention Education Not Provided: {PT. DISCHARGED TO OTHER HEALTHCARE FACILITY:SUICIDE PREVENTION EDUCATION NOT PROVIDED ([facility]):  The patient is discharging to another healthcare facility for continuation of treatment.  The patient's medical information, including suicide ideations and risk factors, are a part of the medical information shared with the receiving healthcare facility.  Todd Fitzgerald returned to the [facility] in [place].  Phone number [phone].  [name] 02/11/2013, 12:52 PM

## 2013-02-11 NOTE — Discharge Summary (Signed)
Physician Discharge Summary Note  Patient:  Todd Fitzgerald is an 52 y.o., male MRN:  [id number] DOB:  Aug 17, 1961 Patient phone:  [phone] (home)  Patient address:   [facility] [address] [place] [address],   Date of Admission:  02/04/2013 Date of Discharge: 02/11/2013  Reason for Admission: Suicide attempt patient drank   Discharge Diagnoses: Principal Problem:   Suicide attempt by drug ingestion Active Problems:   Mood disorder   Major depressive disorder, recurrent episode   Non-compliant patient Discharge Diagnoses:  AXIS I: Major Depression, Recurrent severe  AXIS II: Borderline IQ  AXIS III:  Past Medical History   Diagnosis  Date   .  Suicidal ideation      overdose drugs age 89, last 7 years antifreeze ingestion x 4 times   .  CKD (chronic kidney disease)      questionable   .  Heart murmur    .  ED (erectile dysfunction)    .  BPH (benign prostatic hyperplasia)    .  HLD (hyperlipidemia)    .  Ruptured disc, cervical    AXIS IV: other psychosocial or environmental problems and problems related to social environment  AXIS V: 61-70 mild symptoms  Review of Systems  Constitutional: Negative.  Negative for fever, chills, weight loss, malaise/fatigue and diaphoresis.  HENT: Negative for congestion and sore throat.   Eyes: Negative for blurred vision, double vision and photophobia.  Respiratory: Negative for cough, shortness of breath and wheezing.   Cardiovascular: Negative for chest pain, palpitations and PND.  Gastrointestinal: Negative for heartburn, nausea, vomiting, abdominal pain, diarrhea and constipation.  Musculoskeletal: Negative for myalgias, joint pain and falls.  Neurological: Negative for dizziness, tingling, tremors, sensory change, speech change, focal weakness, seizures, loss of consciousness, weakness and headaches.  Endo/Heme/Allergies: Negative for polydipsia. Does not bruise/bleed easily.  Psychiatric/Behavioral: Negative for  depression, suicidal ideas, hallucinations, memory loss and substance abuse. The patient is not nervous/anxious and does not have insomnia.    Level of Care:  OP  Hospital Course:  Todd Fitzgerald was admitted to the hospital following a deliberate ingestion of antifreeze. After he was stable medically he was transferred to [facility] for further treatment.  This is his 3rd or 4th suicide attempt by drinking antifreeze.  The hospital chart indicates that Todd Fitzgerald is in a group home in [place] but was on a pass to see family members when he drank the antifreeze.  It is unclear if he was taking his medications while on pass.      On admission to the unit he was incontinent of bladder and bowel and required a sitter to assist him with toileting. He had been on Senokot and Miralax at home and this was discontinued due to the incontinence. Todd Fitzgerald was able to evacuate with out any further episodes of incontinence after these medications were stopped.       Records review indicated a previous use of clozaril prior to his in patient stay, but it had not been restarted. It was restarted at a 50mg  dose and titrated upward to 100mg  BID by the time of his discharge. This was done as it was supposed to be part of his regular home medications and is clearly indicated for his chronic suicidality.      He was also continued on Haldol as well.  He was unable to provide history regarding what other psychiatric medications he had been tried on due to his limited cognitive ability. He was seen daily by  a clinical provider to assess his progress. His response to treatment was assessed by his reports of reduced symptoms, improved mood and affect, and return to normal sleep and appetite.      On the day of discharge Todd Fitzgerald was in stable condition, denied SI/HI/ or AVH. He was in full contact with reality and in much improved condition than upon arrival.  He was evaluated by the team at his group home who felt he was also ready and able to return to the  facility.       Consults:  None  Significant Diagnostic Studies:  labs: please see all pertinent labs for this visit via EMR.  Discharge Vitals:   Blood pressure 149/76, pulse 87, temperature 96.4 F (35.8 C), temperature source Oral, resp. rate 18, height 5\' 8"  (1.727 m), weight 91.627 kg (202 lb). Body mass index is 30.72 kg/(m^2). Lab Results:   Results for orders placed during the hospital encounter of 02/04/13 (from the past 72 hour(s))  CBC WITH DIFFERENTIAL     Status: Abnormal   Collection Time    02/08/13  8:07 PM      Result Value Range   WBC 8.3  4.0 - 10.5 K/uL   RBC 3.97 (*) 4.22 - 5.81 MIL/uL   Hemoglobin 12.0 (*) 13.0 - 17.0 g/dL   HCT 86.5 (*) 78.4 - 69.6 %   MCV 91.2  78.0 - 100.0 fL   MCH 30.2  26.0 - 34.0 pg   MCHC 33.1  30.0 - 36.0 g/dL   RDW 29.5  28.4 - 13.2 %   Platelets 368  150 - 400 K/uL   Neutrophils Relative 67  43 - 77 %   Neutro Abs 5.6  1.7 - 7.7 K/uL   Lymphocytes Relative 19  12 - 46 %   Lymphs Abs 1.6  0.7 - 4.0 K/uL   Monocytes Relative 8  3 - 12 %   Monocytes Absolute 0.7  0.1 - 1.0 K/uL   Eosinophils Relative 5  0 - 5 %   Eosinophils Absolute 0.4  0.0 - 0.7 K/uL   Basophils Relative 1  0 - 1 %   Basophils Absolute 0.0  0.0 - 0.1 K/uL  CBC WITH DIFFERENTIAL     Status: Abnormal   Collection Time    02/11/13  6:25 AM      Result Value Range   WBC 8.0  4.0 - 10.5 K/uL   RBC 4.00 (*) 4.22 - 5.81 MIL/uL   Hemoglobin 12.2 (*) 13.0 - 17.0 g/dL   HCT 44.0 (*) 10.2 - 72.5 %   MCV 90.8  78.0 - 100.0 fL   MCH 30.5  26.0 - 34.0 pg   MCHC 33.6  30.0 - 36.0 g/dL   RDW 36.6  44.0 - 34.7 %   Platelets 366  150 - 400 K/uL   Neutrophils Relative 68  43 - 77 %   Neutro Abs 5.4  1.7 - 7.7 K/uL   Lymphocytes Relative 19  12 - 46 %   Lymphs Abs 1.6  0.7 - 4.0 K/uL   Monocytes Relative 6  3 - 12 %   Monocytes Absolute 0.4  0.1 - 1.0 K/uL   Eosinophils Relative 6 (*) 0 - 5 %   Eosinophils Absolute 0.5  0.0 - 0.7 K/uL   Basophils Relative 1  0 - 1 %    Basophils Absolute 0.1  0.0 - 0.1 K/uL    Physical Findings: AIMS: Facial and Oral  Movements Muscles of Facial Expression: None, normal Lips and Perioral Area: None, normal Jaw: None, normal Tongue: None, normal,Extremity Movements Upper (arms, wrists, hands, fingers): None, normal Lower (legs, knees, ankles, toes): None, normal, Trunk Movements Neck, shoulders, hips: None, normal, Overall Severity Severity of abnormal movements (highest score from questions above): None, normal Incapacitation due to abnormal movements: None, normal Patient's awareness of abnormal movements (rate only patient's report): No Awareness, Dental Status Current problems with teeth and/or dentures?: Yes Does patient usually wear dentures?: No  CIWA:  CIWA-Ar Total: 3 COWS:     Psychiatric Specialty Exam: See Psychiatric Specialty Exam and Suicide Risk Assessment completed by Attending Physician prior to discharge.  Discharge destination:  ALF  Is patient on multiple antipsychotic therapies at discharge:  Yes,   Do you recommend tapering to monotherapy for antipsychotics?  No   Has Patient had three or more failed trials of antipsychotic monotherapy by history:  This is unknown and patient is not able to provide this information. He was admitted to [facility] after being treated in the hospital for his antifreeze overdose. Contact with the facility Director could only provide current medications.  Recommended Plan for Multiple Antipsychotic Therapies: Second antipsychotic is Clozapine.  Reason for adding Clozapine Clozapine is indicated for suicidality. This patient has had several serious suicide attempts by drinking Antifreeze.  Discharge Orders   Future Orders Complete By Expires     Diet - low sodium heart healthy  As directed     Discharge instructions  As directed     Comments:      Take all your medications as prescribed by your mental healthcare provider. Report any adverse effects and or reactions  from your medicines to your outpatient provider promptly. Patient is instructed and cautioned to not engage in alcohol and or illegal drug use while on prescription medicines. In the event of worsening symptoms, patient is instructed to call the crisis hotline, 911 and or go to the nearest ED for appropriate evaluation and treatment of symptoms. Follow-up with your primary care provider for your other medical issues, concerns and or health care needs.    Increase activity slowly  As directed         Medication List    STOP taking these medications       aspirin EC 81 MG tablet     isoniazid 300 MG tablet  Commonly known as:  NYDRAZID      TAKE these medications     Indication   atropine 1 % ophthalmic solution  Place 2 drops into both eyes at bedtime.    dry eyes   benztropine 1 MG tablet  Commonly known as:  COGENTIN  Take 1 tablet (1 mg total) by mouth 2 (two) times daily. For side effects   Indication:  Extrapyramidal Reaction caused by Medications     cholecalciferol 400 UNITS Tabs  Commonly known as:  VITAMIN D  Take 2 tablets (800 Units total) by mouth at bedtime. For vitamin deficiency    Vitamin D deficiency   citalopram 40 MG tablet  Commonly known as:  CELEXA  Take 1 tablet (40 mg total) by mouth daily. For depression   Indication:  Depression     cloZAPine 100 MG tablet  Commonly known as:  CLOZARIL  Take 1 tablet (100 mg total) by mouth 2 (two) times daily. For psychosis.   Indication:  Suicidal Tendencies     fish oil-omega-3 fatty acids 1000 MG capsule  Take 1 capsule (1  g total) by mouth at bedtime. Hyperlipidemia.    Hyperlipidemia   gemfibrozil 600 MG tablet  Commonly known as:  LOPID  Take 1 tablet (600 mg total) by mouth 2 (two) times daily. For elevated cholesterol.   Indication:  Increased Fats, Triglycerides & Cholesterol in the Blood     haloperidol 5 MG tablet  Commonly known as:  HALDOL  Take 1 tablet (5 mg total) by mouth daily after  breakfast. For psychosis.   Indication:  Psychosis     LORazepam 0.5 MG tablet  Commonly known as:  ATIVAN  Take 1 tablet (0.5 mg total) by mouth 2 (two) times daily. For anxiety.   Indication:  Feeling Anxious     pantoprazole 20 MG tablet  Commonly known as:  PROTONIX  Take 1 tablet (20 mg total) by mouth 2 (two) times daily. For GERD   Indication:  Gastroesophageal Reflux Disease     polyethylene glycol packet  Commonly known as:  MIRALAX / GLYCOLAX  Take 17 g by mouth daily. For constipation.    for constipation   pyridOXINE 100 MG tablet  Commonly known as:  VITAMIN B-6  Take 0.5 tablets (50 mg total) by mouth daily. For nutritional support.    for B6 deficiency   senna 8.6 MG tablet  Commonly known as:  SENOKOT  Take 2 tablets (17.2 mg total) by mouth 2 (two) times daily. For constipation.   Indication:  Constipation     simvastatin 20 MG tablet  Commonly known as:  ZOCOR  Take 1 tablet (20 mg total) by mouth daily. For elevated cholesterol   Indication:  Type II A Hyperlipidemia     Tamsulosin HCl 0.4 MG Caps  Commonly known as:  FLOMAX  Take 1 capsule (0.4 mg total) by mouth daily after breakfast. For BPH   Indication:  Enlarged Prostate with Urination Problems     traZODone 100 MG tablet  Commonly known as:  DESYREL  Take 2 tablets (200 mg total) by mouth at bedtime.   Indication:  Insomnia           Follow-up Information   Follow up with [facility] ACT team On 02/14/2013. (Someone from the ACT team will see you on Monday)    Contact information:   [address]  [place]  [phone]       Follow-up recommendations:  As noted above.  Comments:  This patient was continued on two anti-psychotics as noted as this was his regular home medication prior to admission. Tapering to monotherapy is at the discretion of his primary out patient psychiatric provider.   Total Discharge Time:  Greater than 30 minutes.  Signed: Rona Ravens. Jamire Shabazz RPAC 02/11/2013,  12:28 PM
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
## 2013-02-14 NOTE — Discharge Summary (Signed)
Seen and agreed. Rogelio Winbush, MD 

## 2013-02-16 NOTE — Progress Notes (Signed)
Patient Discharge Instructions:  After Visit Summary (AVS):   Faxed to:  02/16/13 Discharge Summary Note:   Faxed to:  02/16/13 Psychiatric Admission Assessment Note:   Faxed to:  02/16/13 Suicide Risk Assessment - Discharge Assessment:   Faxed to:  02/16/13 Faxed/Sent to the Next Level Care provider:  02/16/13 Faxed to [facility] @ [phone]  Jerelene Redden, 02/16/2013, 4:08 PM

## 2013-03-14 ENCOUNTER — Other Ambulatory Visit (HOSPITAL_COMMUNITY): Payer: Self-pay | Admitting: Physician Assistant

## 2013-06-22 ENCOUNTER — Ambulatory Visit: Payer: Self-pay | Admitting: Family Medicine

## 2013-07-05 ENCOUNTER — Ambulatory Visit: Payer: Self-pay | Admitting: Family Medicine

## 2014-05-26 IMAGING — CR DG ABDOMEN 2V
2 series · 2 of 2 positions shown · non-contrast
Comparison: 10/22/2008

CLINICAL DATA: Abdominal pain and distension.

ABDOMEN - 2 VIEW

[w abdomen upright]
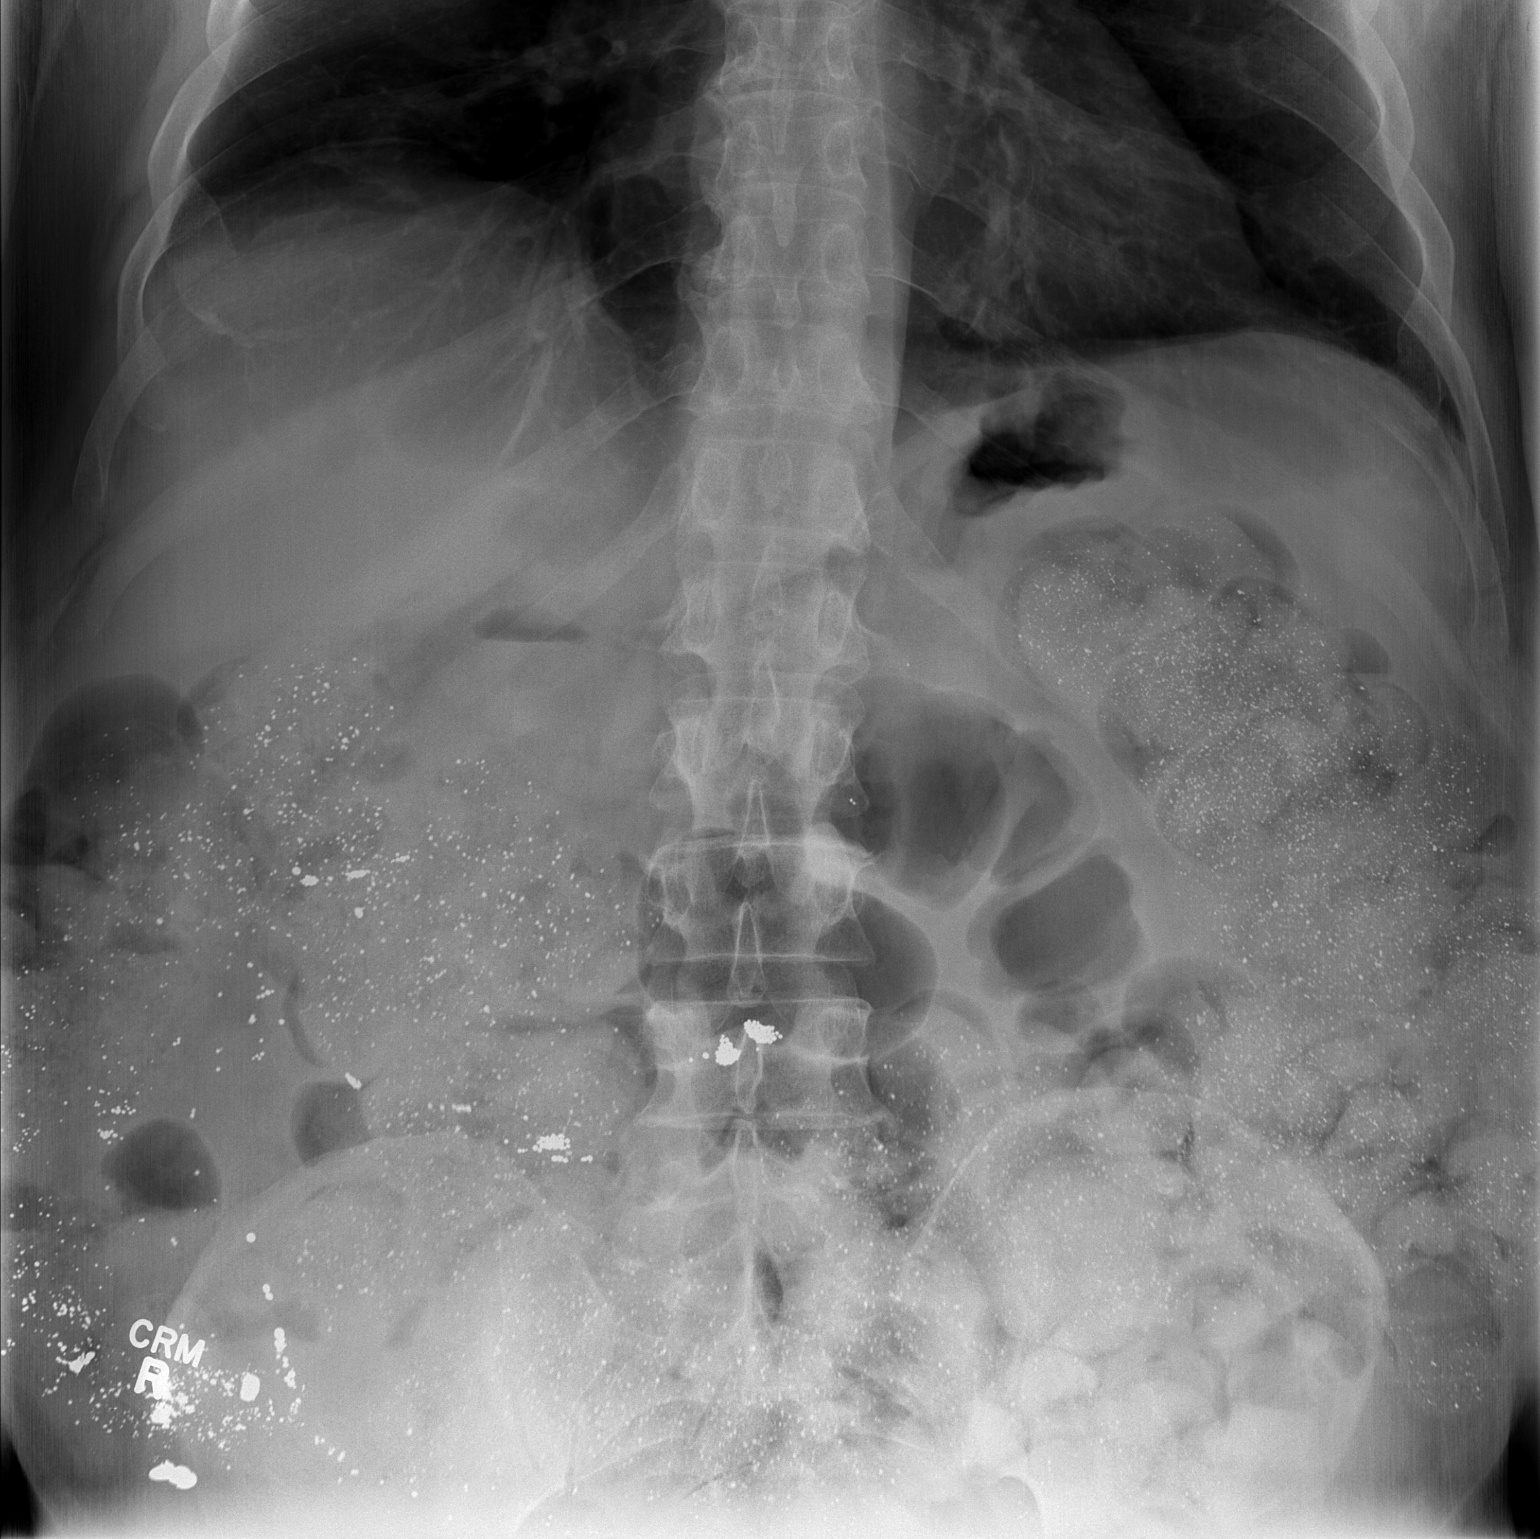

[t abdomen supine]
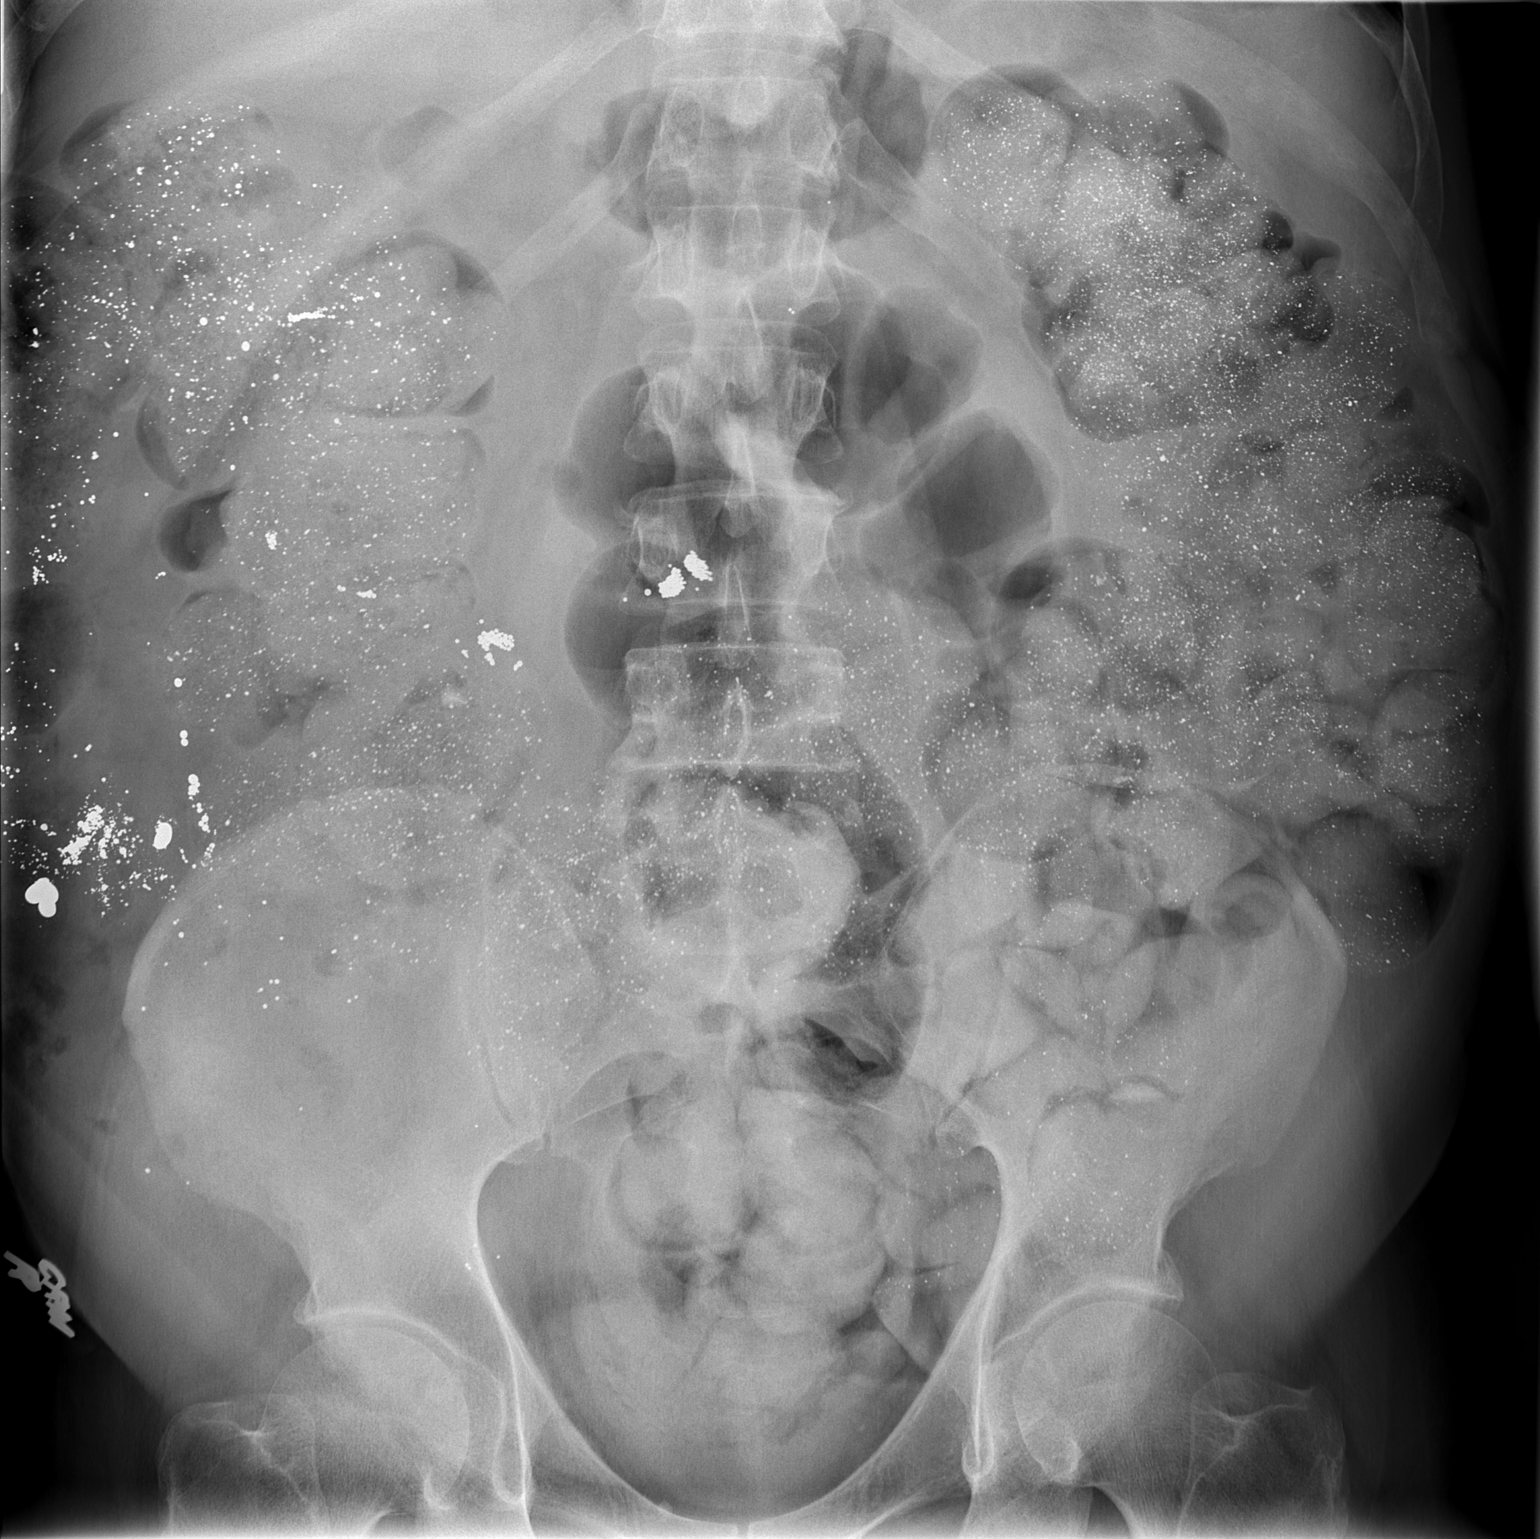

[2 of 2 positions shown; findings below may reference images not displayed]

FINDINGS: There is a moderate to large stool burden.  Flecks of
high attenuation may reflect ingested debris within the colon.
Relative paucity of small bowel gas.  No free intraperitoneal air.
No acute osseous finding.
IMPRESSION: Moderate to large stool burden.

Relative paucity of small bowel gas may reflect decompressed or
fluid filled loops.

## 2014-08-28 ENCOUNTER — Emergency Department: Payer: Self-pay | Admitting: Emergency Medicine

## 2014-08-28 LAB — ACETAMINOPHEN LEVEL

## 2014-08-28 LAB — COMPREHENSIVE METABOLIC PANEL
ALK PHOS: 114 U/L
ANION GAP: 8 (ref 7–16)
Albumin: 3.8 g/dL (ref 3.4–5.0)
BUN: 11 mg/dL (ref 7–18)
Bilirubin,Total: 0.2 mg/dL (ref 0.2–1.0)
CALCIUM: 9 mg/dL (ref 8.5–10.1)
CO2: 26 mmol/L (ref 21–32)
Chloride: 105 mmol/L (ref 98–107)
Creatinine: 1.15 mg/dL (ref 0.60–1.30)
EGFR (African American): >60
EGFR (Non-African Amer.): >60
Glucose: 151 mg/dL — ABNORMAL HIGH (ref 65–99)
OSMOLALITY: 280 (ref 275–301)
POTASSIUM: 4.2 mmol/L (ref 3.5–5.1)
SGOT(AST): 26 U/L (ref 15–37)
SGPT (ALT): 40 U/L
SODIUM: 139 mmol/L (ref 136–145)
Total Protein: 7 g/dL (ref 6.4–8.2)

## 2014-08-28 LAB — CBC
HCT: 43.8 % (ref 40.0–52.0)
HGB: 14.9 g/dL (ref 13.0–18.0)
MCH: 31.5 pg (ref 26.0–34.0)
MCHC: 33.9 g/dL (ref 32.0–36.0)
MCV: 93 fL (ref 80–100)
Platelet: 182 10*3/uL (ref 150–440)
RBC: 4.73 10*6/uL (ref 4.40–5.90)
RDW: 13.2 % (ref 11.5–14.5)
WBC: 7 10*3/uL (ref 3.8–10.6)

## 2014-08-28 LAB — DRUG SCREEN, URINE
AMPHETAMINES, UR SCREEN: NEGATIVE (ref ?–1000)
BARBITURATES, UR SCREEN: NEGATIVE (ref ?–200)
Benzodiazepine, Ur Scrn: NEGATIVE (ref ?–200)
COCAINE METABOLITE, UR ~~LOC~~: NEGATIVE (ref ?–300)
Cannabinoid 50 Ng, Ur ~~LOC~~: NEGATIVE (ref ?–50)
MDMA (ECSTASY) UR SCREEN: NEGATIVE (ref ?–500)
Methadone, Ur Screen: NEGATIVE (ref ?–300)
Opiate, Ur Screen: NEGATIVE (ref ?–300)
PHENCYCLIDINE (PCP) UR S: NEGATIVE (ref ?–25)
Tricyclic, Ur Screen: NEGATIVE (ref ?–1000)

## 2014-08-28 LAB — URINALYSIS, COMPLETE
BACTERIA: NONE SEEN
BILIRUBIN, UR: NEGATIVE
Blood: NEGATIVE
GLUCOSE, UR: NEGATIVE mg/dL (ref 0–75)
KETONE: NEGATIVE
LEUKOCYTE ESTERASE: NEGATIVE
Nitrite: NEGATIVE
PROTEIN: NEGATIVE
Ph: 6 (ref 4.5–8.0)
RBC,UR: <1 /HPF (ref 0–5)
Specific Gravity: 1.002 (ref 1.003–1.030)
Squamous Epithelial: NONE SEEN
WBC UR: NONE SEEN /HPF (ref 0–5)

## 2014-08-28 LAB — ETHANOL: Ethanol: <3 mg/dL

## 2014-08-28 LAB — TSH: Thyroid Stimulating Horm: 0.734 u[IU]/mL

## 2014-08-28 LAB — SALICYLATE LEVEL: Salicylates, Serum: 2.9 mg/dL — ABNORMAL HIGH

## 2014-10-21 IMAGING — CR DG ABDOMEN 3V
1 series · 5 of 5 positions shown · non-contrast
Comparison: none

REASON FOR EXAM: pain
COMMENTS:

[Series 1: w chest pa · 0.14mm/px · 5 of 5 slices shown]
[im 1/5]
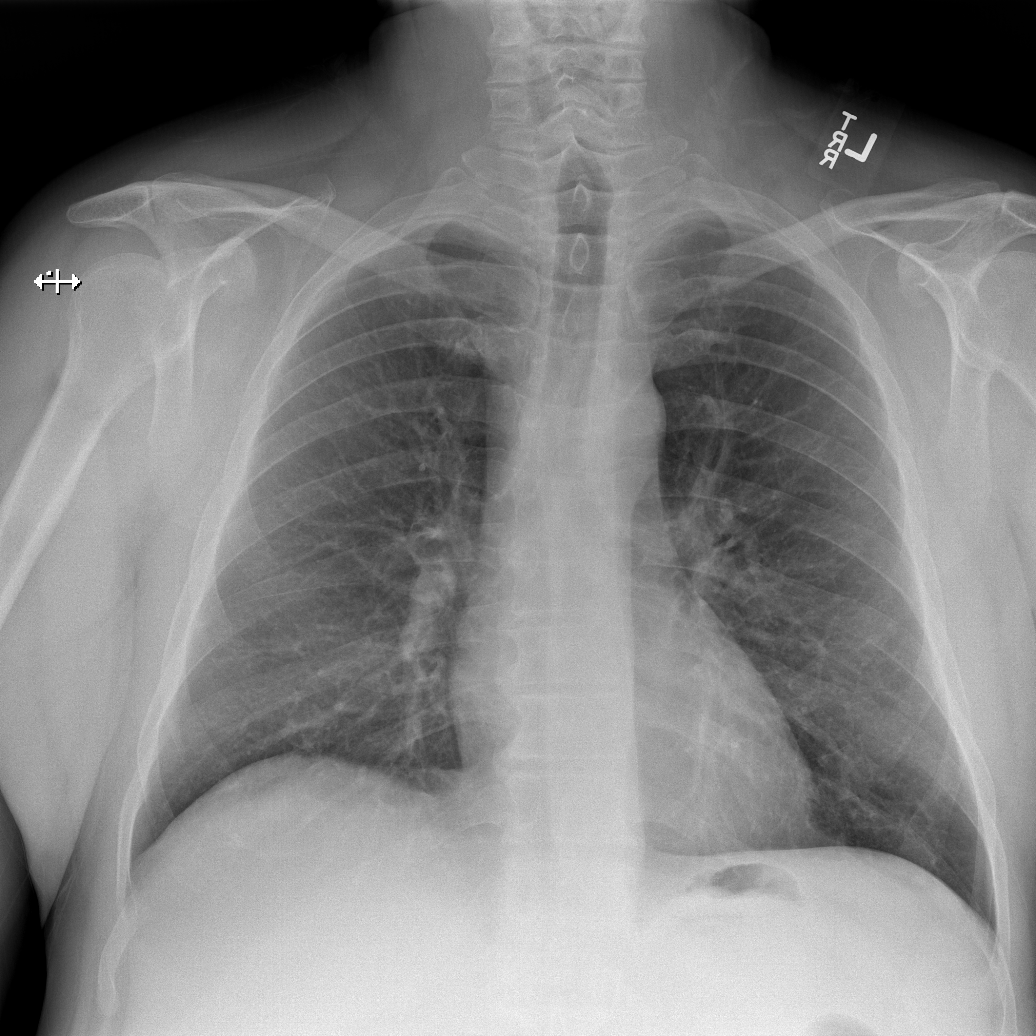
[im 2/5]
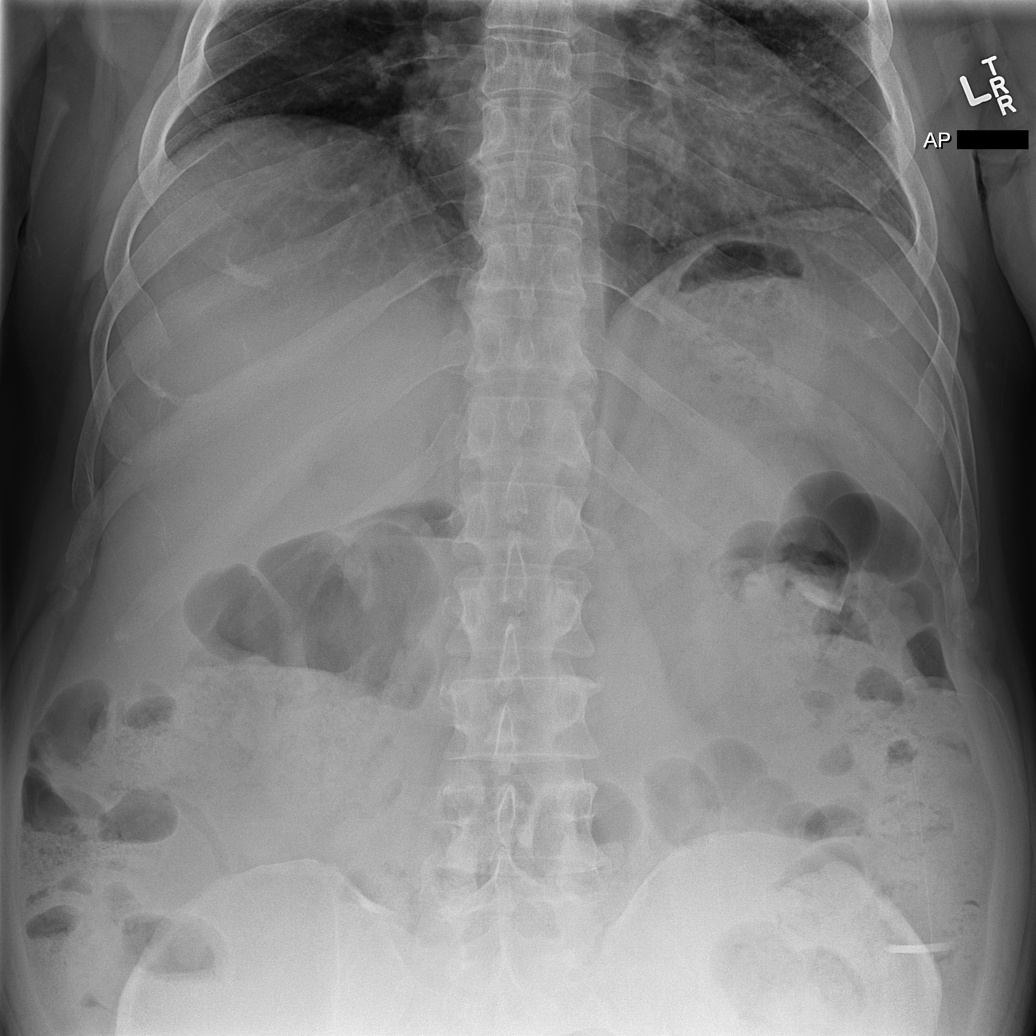
[im 3/5]
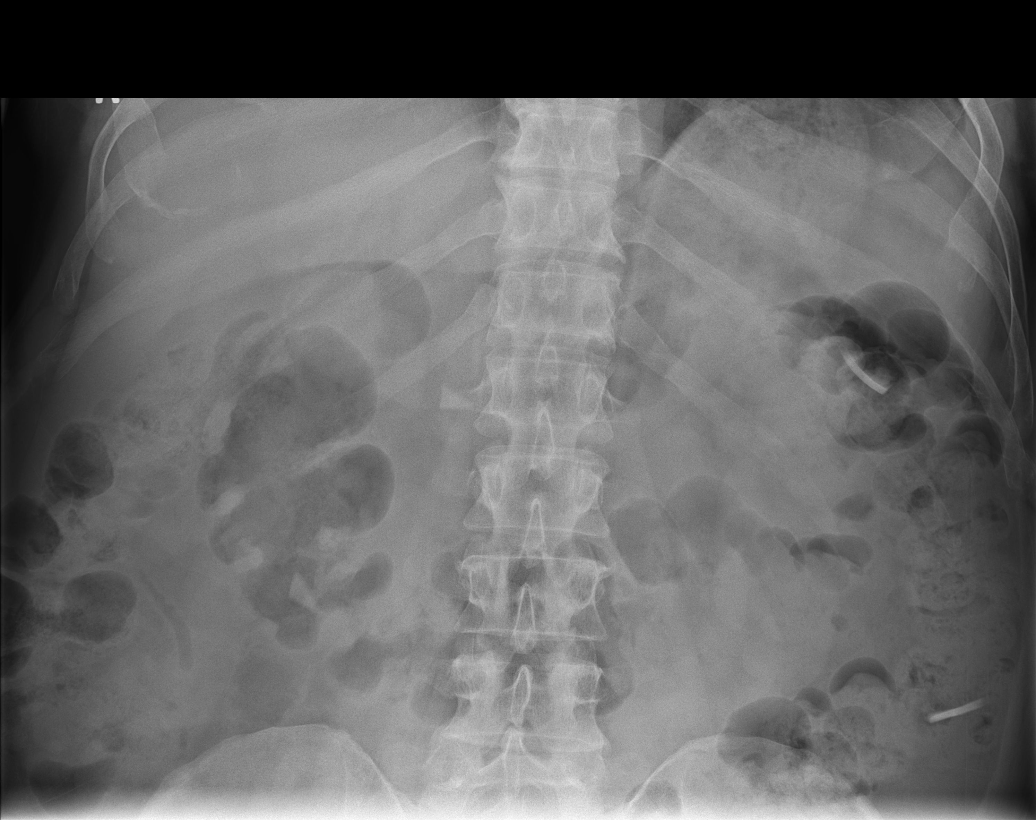
[im 4/5]
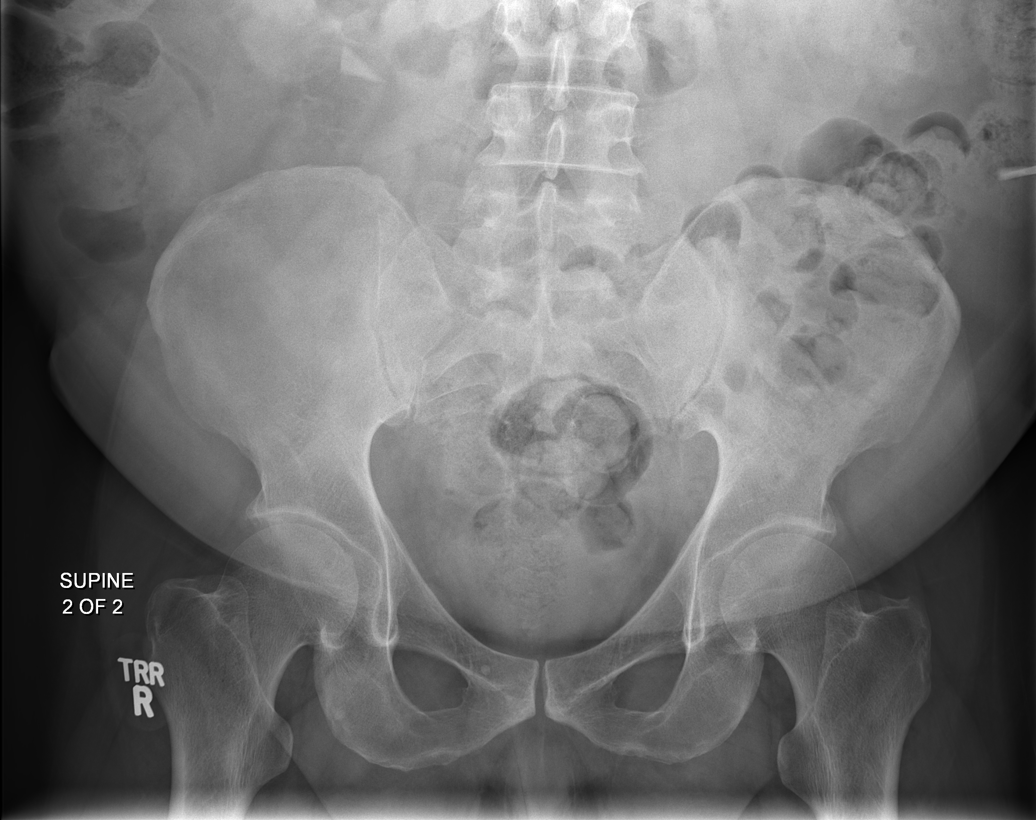
[im 5/5]
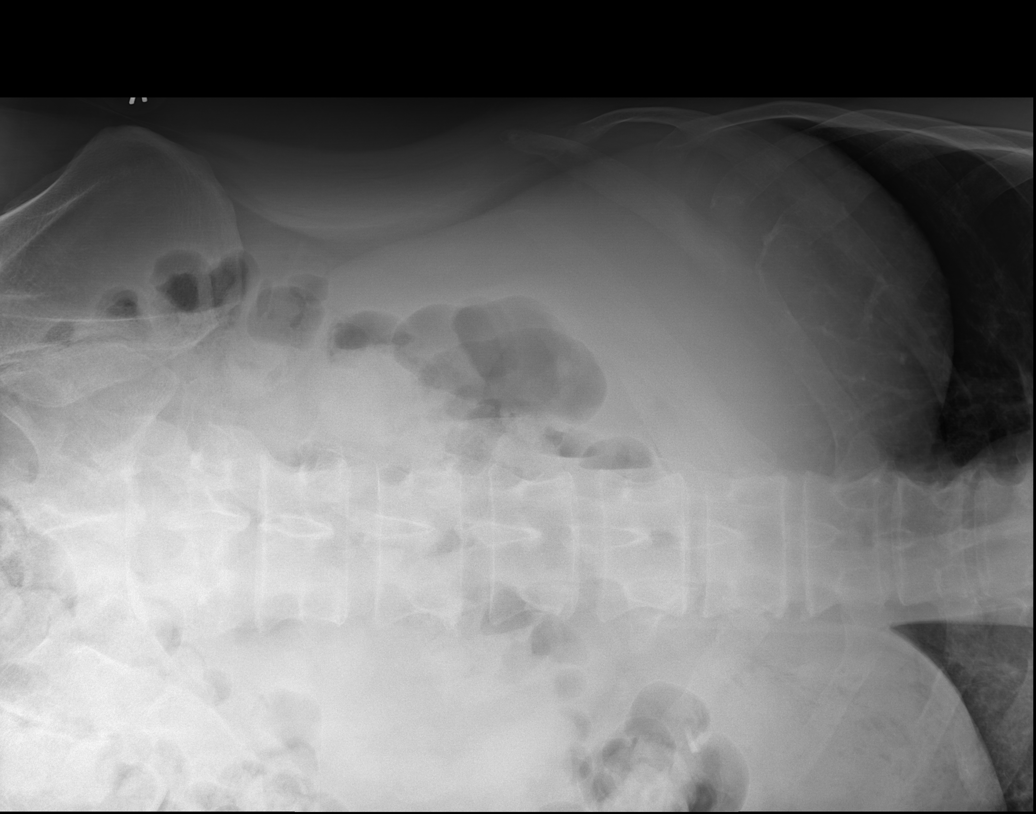

[5 of 5 positions shown; findings below may reference images not displayed]

PROCEDURE:     DXR - DXR ABDOMEN 3-WAY (INCL PA CXR)  - June 22, 2013  [DATE]

RESULT:     The chest film reveals the lungs to be adequately inflated.
There is no focal infiltrate. Interstitial markings are minimally prominent
in the perihilar region. The cardiac silhouette is normal in size. The
mediastinum is normal in width. There is no pleural effusion.

Within the abdomen there is a moderate amount of stool and small amount of
gas within the colon. The pattern suggests constipation. There are
radiodense structures which likely reflect foreign material projecting on
the right over the L1 transverse process as well as approximately 4 cm above
the right iliac crest and on the left in the upper quarter and lower
quadrant. The bony structures exhibit no acute abnormalities.
IMPRESSION: 1. There is minimal prominence of the perihilar lung markings which is
nonspecific but could reflect subsegmental atelectasis.
2. The bowel gas pattern suggests constipation. There is radiodense material
projecting in the upper and lower quadrant of the abdomen bilaterally which
are nonspecific which suggests foreign bodies.

[REDACTED]

## 2015-04-21 NOTE — Consult Note (Signed)
PATIENT NAME:  Todd Fitzgerald, Todd Fitzgerald MR#:  [id number] DATE OF BIRTH:  1961-02-04  DATE OF CONSULTATION:  08/28/2014  REFERRING PHYSICIAN:   CONSULTING PHYSICIAN:  Todd Amel, MD  IDENTIFYING INFORMATION AND REASON FOR CONSULTATION: This is a 54 year old man with a history of schizophrenia who was sent over here from the colonoscopy department. He had come in for a colonoscopy and apparently told them there what they thought was information that he had swallowed razor blades. He was diverted over here to the Emergency Room. On interview today, the patient tells me that he is dead. He says that he can just feel bad and just looks like it to himself. He denies that he has been feeling particularly down or depressed. Denies any particular changes in his mood. Denies hearing voices or seeing things. He denies any wish to die or any thoughts about killing himself. He says he is compliant with his usual medicines as prescribed. He does not think they treat him all that well at the group home because he says they make fun of him all the time, although he cannot be specific about in what way he thinks that they make fun of him. He says that he does not sleep all that well at night, although he takes medication regularly.   PAST PSYCHIATRIC HISTORY: The patient has a history of schizophrenia, has had multiple admissions in the but it looks like he has not been here in several years. He has been treated with a variety of antipsychotics. According to the group home, his statements about being dead are a chronic and standard thing for him which he always says and never changes. He has a past history of having tried to kill himself by swallowing razor blades which happened several years ago. He keeps talking about how he thinks it was just a week or 2 ago, but this is not true, it was in fact several years apparently.   FAMILY HISTORY: The patient denies any family history of mental illness.   SOCIAL HISTORY: Lives in a  group home. Says his mother is still living and he stays in touch with her a little bit. Does not have much other social contact.   PAST MEDICAL HISTORY: We do not have a lot of documentation or collateral history. He does not know any of his medications. He is not sure whether or if he is on any other medicines or has any other medical problems.   SUBSTANCE ABUSE HISTORY: Says that he used to drink and use drugs, but it has been many years since then and does not do any of that anymore.   REVIEW OF SYSTEMS: The patient endorses a belief that he is dead. Otherwise, denies depression. Says he has trouble falling asleep at night. Denies suicidal or homicidal ideation. Denies hallucinations. He has no new physical symptoms. Denies pain, malaise. Full review of systems otherwise negative.   CURRENT MEDICATIONS: Unknown.   ALLERGIES: REPORTEDLY HALDOL.   MENTAL STATUS EXAMINATION: Slightly disheveled gentleman, looks his stated age who is passively cooperative with the interview. Eye contact good. Psychomotor activity slow. Speech is decreased in total amount. Affect flat. Mood stated as okay. Thoughts are simple, a little bit of thought blocking. The only bizarre statements he makes is his insistence that he is already dead. He denies any hallucinations. He is alert and oriented x 4, could remember 3/3 objects at 2 minutes.   LABORATORY RESULTS: A 3-way x-ray of the abdomen shows a 5  cm tubular foreign body in the rectal area. It is unclear what this is, but it certainly is not a razor blade. The chemistry panel shows an elevated glucose 151. Drug screen negative. CBC normal. Alcohol negative. Urinalysis negative.   VITAL SIGNS: Blood pressure 137/67, respirations 20, pulse 84, temperature 98.   ASSESSMENT: A 54 year old man with a history of schizophrenia. Everything that we are being told by the group home would suggest that he is at his baseline mental state. His statement that he is already dead is  a chronic delusion that does not change. He denies any suicidal ideation at all and there is no indication that he has done anything to try and harm himself. We do not have a complete set of history available right now that I can find about what medicines he is on, but that did not appear to have been an acute issue that brought him in. The group home actually brought him here to have a colonoscopy.   TREATMENT PLAN: He is not under involuntary commitment. He does not need psychiatric hospitalization. He apparently already has outpatient psychiatric treatment and sees an ACT team. Dr. Cherylann RatelLateef is his doctor. He can be released from the Emergency Room and discharged back to the group home.   DIAGNOSIS, PRINCIPAL AND PRIMARY:  AXIS I: Schizophrenia, undifferentiated.   SECONDARY DIAGNOSES:  AXIS I: No further.  AXIS II: None. AXIS III: Deferred.  AXIS V: Functioning at time of evaluation 57.   ____________________________ Todd AmelJohn T. Marieelena Bartko, MD jtc:TT D: 08/28/2014 16:59:31 ET T: 08/28/2014 17:25:05 ET JOB#: [id number]  cc: Todd AmelJohn T. Brandon Wiechman, MD, <Dictator> Todd AmelJOHN T Kaavya Puskarich MD ELECTRONICALLY SIGNED 09/08/2014 17:00

## 2015-04-21 NOTE — Consult Note (Signed)
Brief Consult Note: Diagnosis: schizophrenia.   Patient was seen by consultant.   Consult note dictated.   Discussed with Attending MD.   Comments: PSychiatry: Patient seen and chart reviewed. We have gotten collateral history from group home. PAtient has schizophrenia and is chronically prone to saying he is "dead". He denies to me any suicidal ideation or intent or plan. Appears to be at baseline. Can be released home. Not under IVC.  Electronic Signatures: Audery Amellapacs, John T (MD)  (Signed 31-Aug-15 16:50)  Authored: Brief Consult Note   Last Updated: 31-Aug-15 16:50 by Audery Amellapacs, John T (MD)

## 2023-01-29 DEATH — deceased
